# Patient Record
Sex: Male | Born: 1975 | Race: Black or African American | Hispanic: No | Marital: Single | Smoking: Never smoker
Health system: Southern US, Community
[De-identification: ages and names within clinical notes are randomized; demographics above are authoritative.]

## PROBLEM LIST (undated history)

## (undated) DIAGNOSIS — N200 Calculus of kidney: Secondary | ICD-10-CM

## (undated) DIAGNOSIS — I10 Essential (primary) hypertension: Secondary | ICD-10-CM

## (undated) DIAGNOSIS — B20 Human immunodeficiency virus [HIV] disease: Secondary | ICD-10-CM

## (undated) DIAGNOSIS — R569 Unspecified convulsions: Secondary | ICD-10-CM

## (undated) DIAGNOSIS — N289 Disorder of kidney and ureter, unspecified: Secondary | ICD-10-CM

## (undated) DIAGNOSIS — K259 Gastric ulcer, unspecified as acute or chronic, without hemorrhage or perforation: Secondary | ICD-10-CM

## (undated) DIAGNOSIS — G629 Polyneuropathy, unspecified: Secondary | ICD-10-CM

---

## 2020-10-01 ENCOUNTER — Emergency Department (HOSPITAL_COMMUNITY)
Admission: EM | Admit: 2020-10-01 | Discharge: 2020-10-01 | Disposition: A | Discharge status: Home / Self Care | Payer: Self-pay | Attending: Emergency Medicine | Admitting: Emergency Medicine

## 2020-10-01 ENCOUNTER — Other Ambulatory Visit: Payer: Self-pay

## 2020-10-01 ENCOUNTER — Encounter (HOSPITAL_COMMUNITY): Payer: Self-pay

## 2020-10-01 DIAGNOSIS — R519 Headache, unspecified: Secondary | ICD-10-CM | POA: Insufficient documentation

## 2020-10-01 DIAGNOSIS — Z21 Asymptomatic human immunodeficiency virus [HIV] infection status: Secondary | ICD-10-CM | POA: Insufficient documentation

## 2020-10-01 DIAGNOSIS — N189 Chronic kidney disease, unspecified: Secondary | ICD-10-CM | POA: Insufficient documentation

## 2020-10-01 LAB — CBC WITH DIFFERENTIAL/PLATELET
Abs Immature Granulocytes: 0.02 10*3/uL (ref 0.00–0.07)
Basophils Absolute: 0 10*3/uL (ref 0.0–0.1)
Basophils Relative: 1 %
Eosinophils Absolute: 0.1 10*3/uL (ref 0.0–0.5)
Eosinophils Relative: 1 %
HCT: 33.2 % — ABNORMAL LOW (ref 39.0–52.0)
Hemoglobin: 9.6 g/dL — ABNORMAL LOW (ref 13.0–17.0)
Immature Granulocytes: 0 %
Lymphocytes Relative: 21 %
Lymphs Abs: 1.1 10*3/uL (ref 0.7–4.0)
MCH: 24 pg — ABNORMAL LOW (ref 26.0–34.0)
MCHC: 28.9 g/dL — ABNORMAL LOW (ref 30.0–36.0)
MCV: 83 fL (ref 80.0–100.0)
Monocytes Absolute: 0.7 10*3/uL (ref 0.1–1.0)
Monocytes Relative: 13 %
Neutro Abs: 3.3 10*3/uL (ref 1.7–7.7)
Neutrophils Relative %: 64 %
Platelets: 135 10*3/uL — ABNORMAL LOW (ref 150–400)
RBC: 4 MIL/uL — ABNORMAL LOW (ref 4.22–5.81)
RDW: 13.9 % (ref 11.5–15.5)
WBC: 5.2 10*3/uL (ref 4.0–10.5)
nRBC: 0 % (ref 0.0–0.2)

## 2020-10-01 LAB — BASIC METABOLIC PANEL
Anion gap: 7 (ref 5–15)
BUN: 30 mg/dL — ABNORMAL HIGH (ref 6–20)
CO2: 29 mmol/L (ref 22–32)
Calcium: 9.2 mg/dL (ref 8.9–10.3)
Chloride: 106 mmol/L (ref 98–111)
Creatinine, Ser: 2.22 mg/dL — ABNORMAL HIGH (ref 0.61–1.24)
GFR, Estimated: 36 mL/min — ABNORMAL LOW (ref >60)
Glucose, Bld: 96 mg/dL (ref 70–99)
Potassium: 4.2 mmol/L (ref 3.5–5.1)
Sodium: 142 mmol/L (ref 135–145)

## 2020-10-01 MED ORDER — DROPERIDOL 2.5 MG/ML IJ SOLN
2.5000 mg | Freq: Once | INTRAMUSCULAR | Status: AC
  Administered 2020-10-01: 11:00:00 2.5 mg via INTRAVENOUS
  Filled 2020-10-01: qty 2

## 2020-10-01 NOTE — ED Notes (Signed)
Pt is irate in lobby complaining about wait time. Still refusing to allow a blood pressure to be taken. Pt is self inducing vomiting by stimulating his gag reflex with fingers. Pt also refuses to use an emesis bag

## 2020-10-01 NOTE — Progress Notes (Signed)
TOC CM reviewed chart to assist with arranging appt. No phone number listed to call pt. TOC CSW did put information on chart to arrange appt at the [facility] and [facility]. Isidoro Donning RN CCM, [facility] ED TOC CM [phone]

## 2020-10-01 NOTE — ED Triage Notes (Signed)
Pt arrives EMS with c/o recurrent headache. Pt not answering triage questions and rips bp cuff off during triage.

## 2020-10-01 NOTE — Clinical Social Work Note (Signed)
Transition of Care [facility]) - Emergency Department Mini Assessment  Patient Details  Name: Chad Campos MRN: [id number] Date of Birth: 1976/02/24  Transition of Care [facility]) CM/SW Contact:    Ewing Schlein, LCSW Phone Number: 10/01/2020, 11:48 AM  Clinical Narrative: Patient is a 44 year old male who presented to the ED for a headache. TOC consulted for PCP needs as patient is uninsured. [facility] was added to AVS. TOC signing off.  ED Mini Assessment: What brought you to the Emergency Department? : Headache Barriers to Discharge: Inadequate or no insurance,ED PCP establishment Barrier interventions: Referral information for [facility] Means of departure: Car Interventions which prevented an admission or readmission: PCP Appointment Scheduled  Patient Contact and Communications Choice offered to / list presented to : Patient  Admission diagnosis:  Migraine There are no problems to display for this patient.  PCP:  Patient, No Pcp Per Pharmacy:   [id number] - [place], [place] - [address] AT [facility] OF [address] [place] [address] Phone: [phone] Fax: [phone]

## 2020-10-01 NOTE — ED Provider Notes (Signed)
[facility]-EMERGENCY DEPT Provider Note   CSN: [id number] Arrival date & time: 10/01/20  5681     History Chief Complaint  Patient presents with  . Headache    Chad Campos is a 44 y.o. male.  The history is provided by the patient.   Chad Campos is a 44 y.o. male who presents to the Emergency Department complaining of HA.  He presents to the ED complaining of HA and bilateral eye pain that started at 6pm last night.  Has photophobia.  Has vomiting x 1 he has a history of multiple similar episodes in the past that he attributes to both migraine headaches and history of facial hardware. He moved to the area a few days ago from [place] and has not established local care. He does have a history of HIV, kidney stones. He does not have a local physician. He is not taking HIV meds currently. Prior care has been at [facility] in [place].  Denies fever, numbness, weakness, cough, shortness of breath.      History reviewed. No pertinent past medical history.  There are no problems to display for this patient.   History reviewed. No pertinent surgical history.     No family history on file.  Social History   Tobacco Use  . Smoking status: Never Smoker  . Smokeless tobacco: Never Used  Substance Use Topics  . Alcohol use: Never  . Drug use: Never    Home Medications Prior to Admission medications   Not on File    Allergies    Patient has no known allergies.  Review of Systems   Review of Systems  All other systems reviewed and are negative.   Physical Exam Updated Vital Signs BP (!) 157/113 (BP Location: Left Arm)   Pulse 64   Temp 97.9 F (36.6 C) (Oral)   Resp 16   SpO2 99%   Physical Exam Vitals and nursing note reviewed.  Constitutional:      Appearance: He is well-developed and well-nourished.  HENT:     Head: Normocephalic and atraumatic.  Eyes:     Extraocular Movements: Extraocular movements intact.      Conjunctiva/sclera: Conjunctivae normal.     Pupils: Pupils are equal, round, and reactive to light.     Comments: Photophobia  Cardiovascular:     Rate and Rhythm: Normal rate and regular rhythm.     Heart sounds: No murmur heard.   Pulmonary:     Effort: Pulmonary effort is normal. No respiratory distress.     Breath sounds: Normal breath sounds.  Abdominal:     Palpations: Abdomen is soft.     Tenderness: There is no abdominal tenderness. There is no guarding or rebound.  Musculoskeletal:        General: No tenderness or edema.     Cervical back: Neck supple.  Skin:    General: Skin is warm and dry.  Neurological:     Mental Status: He is alert and oriented to person, place, and time.     Comments: Followed five strength in all four extremities with sensation to light touch intact in all four extremities  Psychiatric:        Mood and Affect: Mood and affect normal.        Behavior: Behavior normal.     ED Results / Procedures / Treatments   Labs (all labs ordered are listed, but only abnormal results are displayed) Labs Reviewed  BASIC METABOLIC PANEL - Abnormal;  Notable for the following components:      Result Value   BUN 30 (*)    Creatinine, Ser 2.22 (*)    GFR, Estimated 36 (*)    All other components within normal limits  CBC WITH DIFFERENTIAL/PLATELET - Abnormal; Notable for the following components:   RBC 4.00 (*)    Hemoglobin 9.6 (*)    HCT 33.2 (*)    MCH 24.0 (*)    MCHC 28.9 (*)    Platelets 135 (*)    All other components within normal limits    EKG EKG Interpretation  Date/Time:  Monday October 01 2020 07:55:10 EST Ventricular Rate:  65 PR Interval:    QRS Duration: 92 QT Interval:  394 QTC Calculation: 410 R Axis:   71 Text Interpretation: Sinus rhythm Consider left atrial enlargement Confirmed by Tilden Fossa [phone]) on 10/01/2020 9:22:51 AM   Radiology No results found.  Procedures Procedures (including critical care  time)  Medications Ordered in ED Medications  droperidol (INAPSINE) 2.5 MG/ML injection 2.5 mg (2.5 mg Intravenous Given 10/01/20 1041)    ED Course  I have reviewed the triage vital signs and the nursing notes.  Pertinent labs & imaging results that were available during my care of the patient were reviewed by me and considered in my medical decision making (see chart for details).    MDM Rules/Calculators/A&P                         patient with history of HIV here for evaluation of headache. He currently does not have a PCP locally and is not currently on his medications. He is non-toxic appearing on evaluation with no focal neurologic deficits. He states that this headache is typical for his headaches. He was treated with medications in the emergency department with resolution of his symptoms. Presentation is not consistent with subarachnoid hemorrhage, meningitis, space occupying lesion. Will refer to ID for further treatment for his HIV. Attempted to obtain records from [facility] system in [place], these were not available at the time of patient's discharge.  After discharge his records were obtainable from August 2020. Per the report he has history of HIV, hypertension, seizure, kidney stones, stage 4 kidney disease, suicide attempt, schizoaffective disorder. At that time he was taking prezista, descovy, norvir, trileptal, depakote and gabapentin.  Final Clinical Impression(s) / ED Diagnoses Final diagnoses:  Bad headache  Chronic kidney disease, unspecified CKD stage    Rx / DC Orders ED Discharge Orders         Ordered    Ambulatory referral to Infectious Disease       Comments: Pt with HIV, moved to area from [place] a few days ago, out of meds.   10/01/20 1131           Tilden Fossa, MD 10/01/20 1610

## 2020-10-02 ENCOUNTER — Telehealth: Payer: Self-pay | Admitting: *Deleted

## 2020-10-02 NOTE — Telephone Encounter (Signed)
Following up on new patient referral to [facility].  Claris Che will reach out to patient to have him sign release of information for proof of care/transfer appointment. RN made partial referral to [facility], for [employer], in case patient is in need. Marthann Schiller will need notification once referral to [facility] is accepted. Andree Coss, RN

## 2020-10-02 NOTE — Telephone Encounter (Signed)
His phone number shows [phone] it goes to a business and they don't know patient  have called the contact and no answer

## 2021-02-23 ENCOUNTER — Other Ambulatory Visit: Payer: Self-pay

## 2021-02-23 ENCOUNTER — Inpatient Hospital Stay (HOSPITAL_COMMUNITY)
Admission: EM | Admit: 2021-02-23 | Discharge: 2021-03-05 | DRG: 683 | Disposition: A | Discharge status: Home / Self Care | Payer: Self-pay | Attending: Internal Medicine | Admitting: Internal Medicine

## 2021-02-23 DIAGNOSIS — N1832 Chronic kidney disease, stage 3b: Secondary | ICD-10-CM | POA: Diagnosis present

## 2021-02-23 DIAGNOSIS — G629 Polyneuropathy, unspecified: Secondary | ICD-10-CM | POA: Diagnosis present

## 2021-02-23 DIAGNOSIS — F203 Undifferentiated schizophrenia: Secondary | ICD-10-CM | POA: Diagnosis present

## 2021-02-23 DIAGNOSIS — R45851 Suicidal ideations: Principal | ICD-10-CM | POA: Diagnosis present

## 2021-02-23 DIAGNOSIS — E0590 Thyrotoxicosis, unspecified without thyrotoxic crisis or storm: Secondary | ICD-10-CM | POA: Diagnosis present

## 2021-02-23 DIAGNOSIS — F1910 Other psychoactive substance abuse, uncomplicated: Secondary | ICD-10-CM | POA: Diagnosis present

## 2021-02-23 DIAGNOSIS — Z87891 Personal history of nicotine dependence: Secondary | ICD-10-CM

## 2021-02-23 DIAGNOSIS — D631 Anemia in chronic kidney disease: Secondary | ICD-10-CM | POA: Diagnosis present

## 2021-02-23 DIAGNOSIS — F332 Major depressive disorder, recurrent severe without psychotic features: Secondary | ICD-10-CM | POA: Diagnosis present

## 2021-02-23 DIAGNOSIS — R45850 Homicidal ideations: Secondary | ICD-10-CM | POA: Diagnosis present

## 2021-02-23 DIAGNOSIS — F209 Schizophrenia, unspecified: Secondary | ICD-10-CM | POA: Diagnosis present

## 2021-02-23 DIAGNOSIS — K5900 Constipation, unspecified: Secondary | ICD-10-CM | POA: Diagnosis not present

## 2021-02-23 DIAGNOSIS — I129 Hypertensive chronic kidney disease with stage 1 through stage 4 chronic kidney disease, or unspecified chronic kidney disease: Secondary | ICD-10-CM | POA: Diagnosis present

## 2021-02-23 DIAGNOSIS — R569 Unspecified convulsions: Secondary | ICD-10-CM | POA: Diagnosis present

## 2021-02-23 DIAGNOSIS — Z20822 Contact with and (suspected) exposure to covid-19: Secondary | ICD-10-CM | POA: Diagnosis present

## 2021-02-23 DIAGNOSIS — B181 Chronic viral hepatitis B without delta-agent: Secondary | ICD-10-CM | POA: Diagnosis present

## 2021-02-23 DIAGNOSIS — E875 Hyperkalemia: Secondary | ICD-10-CM | POA: Diagnosis present

## 2021-02-23 DIAGNOSIS — B20 Human immunodeficiency virus [HIV] disease: Secondary | ICD-10-CM | POA: Diagnosis present

## 2021-02-23 DIAGNOSIS — N179 Acute kidney failure, unspecified: Principal | ICD-10-CM | POA: Diagnosis present

## 2021-02-23 DIAGNOSIS — D75A Glucose-6-phosphate dehydrogenase (G6PD) deficiency without anemia: Secondary | ICD-10-CM | POA: Diagnosis present

## 2021-02-23 DIAGNOSIS — F1420 Cocaine dependence, uncomplicated: Secondary | ICD-10-CM | POA: Diagnosis present

## 2021-02-23 DIAGNOSIS — Z781 Physical restraint status: Secondary | ICD-10-CM

## 2021-02-23 DIAGNOSIS — Z87442 Personal history of urinary calculi: Secondary | ICD-10-CM

## 2021-02-23 DIAGNOSIS — F1210 Cannabis abuse, uncomplicated: Secondary | ICD-10-CM | POA: Diagnosis present

## 2021-02-23 LAB — COMPREHENSIVE METABOLIC PANEL
ALT: 33 U/L (ref 0–44)
AST: 51 U/L — ABNORMAL HIGH (ref 15–41)
Albumin: 2.9 g/dL — ABNORMAL LOW (ref 3.5–5.0)
Alkaline Phosphatase: 62 U/L (ref 38–126)
Anion gap: 9 (ref 5–15)
BUN: 37 mg/dL — ABNORMAL HIGH (ref 6–20)
CO2: 21 mmol/L — ABNORMAL LOW (ref 22–32)
Calcium: 9.9 mg/dL (ref 8.9–10.3)
Chloride: 108 mmol/L (ref 98–111)
Creatinine, Ser: 3.14 mg/dL — ABNORMAL HIGH (ref 0.61–1.24)
GFR, Estimated: 24 mL/min — ABNORMAL LOW (ref >60)
Glucose, Bld: 102 mg/dL — ABNORMAL HIGH (ref 70–99)
Potassium: 3.7 mmol/L (ref 3.5–5.1)
Sodium: 138 mmol/L (ref 135–145)
Total Bilirubin: 0.6 mg/dL (ref 0.3–1.2)
Total Protein: 7.5 g/dL (ref 6.5–8.1)

## 2021-02-23 LAB — CBC WITH DIFFERENTIAL/PLATELET
Abs Immature Granulocytes: 0.01 10*3/uL (ref 0.00–0.07)
Basophils Absolute: 0 10*3/uL (ref 0.0–0.1)
Basophils Relative: 1 %
Eosinophils Absolute: 0.1 10*3/uL (ref 0.0–0.5)
Eosinophils Relative: 2 %
HCT: 34 % — ABNORMAL LOW (ref 39.0–52.0)
Hemoglobin: 10.2 g/dL — ABNORMAL LOW (ref 13.0–17.0)
Immature Granulocytes: 0 %
Lymphocytes Relative: 27 %
Lymphs Abs: 1.2 10*3/uL (ref 0.7–4.0)
MCH: 24.6 pg — ABNORMAL LOW (ref 26.0–34.0)
MCHC: 30 g/dL (ref 30.0–36.0)
MCV: 81.9 fL (ref 80.0–100.0)
Monocytes Absolute: 0.7 10*3/uL (ref 0.1–1.0)
Monocytes Relative: 15 %
Neutro Abs: 2.5 10*3/uL (ref 1.7–7.7)
Neutrophils Relative %: 55 %
Platelets: 113 10*3/uL — ABNORMAL LOW (ref 150–400)
RBC: 4.15 MIL/uL — ABNORMAL LOW (ref 4.22–5.81)
RDW: 14.4 % (ref 11.5–15.5)
WBC: 4.4 10*3/uL (ref 4.0–10.5)
nRBC: 0 % (ref 0.0–0.2)

## 2021-02-23 LAB — ACETAMINOPHEN LEVEL: Acetaminophen (Tylenol), Serum: <10 ug/mL — ABNORMAL LOW (ref 10–30)

## 2021-02-23 LAB — RESP PANEL BY RT-PCR (FLU A&B, COVID) ARPGX2
Influenza A by PCR: NEGATIVE
Influenza B by PCR: NEGATIVE
SARS Coronavirus 2 by RT PCR: NEGATIVE

## 2021-02-23 LAB — RAPID URINE DRUG SCREEN, HOSP PERFORMED
Amphetamines: NOT DETECTED
Barbiturates: NOT DETECTED
Benzodiazepines: NOT DETECTED
Cocaine: POSITIVE — AB
Opiates: NOT DETECTED
Tetrahydrocannabinol: POSITIVE — AB

## 2021-02-23 LAB — ETHANOL: Alcohol, Ethyl (B): <10 mg/dL (ref <10)

## 2021-02-23 LAB — SALICYLATE LEVEL: Salicylate Lvl: <7 mg/dL — ABNORMAL LOW (ref 7.0–30.0)

## 2021-02-23 MED ORDER — ACETAMINOPHEN 650 MG RE SUPP: 650.0000 mg | Freq: Four times a day (QID) | RECTAL | Status: DC | PRN

## 2021-02-23 MED ORDER — LORAZEPAM 1 MG PO TABS
1.0000 mg | ORAL_TABLET | ORAL | Status: AC | PRN
  Administered 2021-02-23: 1 mg via ORAL
  Filled 2021-02-23: qty 1

## 2021-02-23 MED ORDER — ZIPRASIDONE MESYLATE 20 MG IM SOLR
20.0000 mg | INTRAMUSCULAR | Status: DC | PRN
  Filled 2021-02-23 (×2): qty 20

## 2021-02-23 MED ORDER — IBUPROFEN 600 MG PO TABS
600.0000 mg | ORAL_TABLET | Freq: Once | ORAL | Status: DC
  Filled 2021-02-23: qty 1

## 2021-02-23 MED ORDER — SENNOSIDES-DOCUSATE SODIUM 8.6-50 MG PO TABS
1.0000 | ORAL_TABLET | Freq: Every evening | ORAL | Status: DC | PRN
  Filled 2021-02-23: qty 1

## 2021-02-23 MED ORDER — SODIUM CHLORIDE 0.9% FLUSH
3.0000 mL | Freq: Two times a day (BID) | INTRAVENOUS | Status: DC
  Administered 2021-02-24 – 2021-03-05 (×17): 3 mL via INTRAVENOUS

## 2021-02-23 MED ORDER — ACETAMINOPHEN 325 MG PO TABS
650.0000 mg | ORAL_TABLET | Freq: Four times a day (QID) | ORAL | Status: DC | PRN
  Administered 2021-02-23 – 2021-02-28 (×4): 650 mg via ORAL
  Filled 2021-02-23 (×5): qty 2

## 2021-02-23 MED ORDER — LORAZEPAM 1 MG PO TABS: 1.0000 mg | ORAL_TABLET | Freq: Once | ORAL | Status: DC

## 2021-02-23 MED ORDER — STERILE WATER FOR INJECTION IJ SOLN
INTRAMUSCULAR | Status: AC
  Administered 2021-02-23: 1.2 mL
  Filled 2021-02-23: qty 10

## 2021-02-23 MED ORDER — ZIPRASIDONE MESYLATE 20 MG IM SOLR
20.0000 mg | Freq: Once | INTRAMUSCULAR | Status: AC
  Administered 2021-02-23: 20 mg via INTRAMUSCULAR
  Filled 2021-02-23: qty 20

## 2021-02-23 MED ORDER — SODIUM CHLORIDE 0.9 % IV BOLUS
1000.0000 mL | Freq: Once | INTRAVENOUS | Status: AC
  Administered 2021-02-23: 1000 mL via INTRAVENOUS

## 2021-02-23 MED ORDER — OLANZAPINE 5 MG PO TBDP
5.0000 mg | ORAL_TABLET | Freq: Three times a day (TID) | ORAL | Status: DC | PRN
  Administered 2021-03-01: 5 mg via ORAL
  Filled 2021-02-23 (×6): qty 1

## 2021-02-23 MED ORDER — ACETAMINOPHEN 325 MG PO TABS: 650.0000 mg | ORAL_TABLET | Freq: Once | ORAL | Status: DC

## 2021-02-23 MED ORDER — LACTATED RINGERS IV SOLN: INTRAVENOUS | Status: AC

## 2021-02-23 MED ORDER — ENOXAPARIN SODIUM 30 MG/0.3ML IJ SOSY: 30.0000 mg | PREFILLED_SYRINGE | INTRAMUSCULAR | Status: DC

## 2021-02-23 NOTE — Plan of Care (Signed)
  Problem: Education: Goal: Knowledge of General Education information will improve Description: Including pain rating scale, medication(s)/side effects and non-pharmacologic comfort measures Outcome: Completed/Met
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
## 2021-02-23 NOTE — ED Notes (Signed)
Report given to [facility] [phone]. Security and 2 NT to take pt to floor. IVC paperwork going with pt.
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
## 2021-02-23 NOTE — Progress Notes (Signed)
   02/23/21 2000  Vitals  Temp 98.6 F (37 C)  Temp Source Oral  BP (!) 156/113  MAP (mmHg) 125  Pulse Rate 63  ECG Heart Rate 65  Resp (!) 22  MD Notified of elevated BP.

## 2021-02-23 NOTE — ED Notes (Addendum)
Report received from [facility]. Pt resting after receiving medication. Pt no longer in restraints. Restraints removed by previous RN. No sitter available for pt.
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
## 2021-02-23 NOTE — ED Notes (Signed)
Report received. Patient sleeping, respirations even and unlabored. No distress. Remains on continuous cardiac and 02 sat monitoring.

## 2021-02-23 NOTE — Progress Notes (Signed)
Report received. His room is ready.  °

## 2021-02-23 NOTE — Progress Notes (Signed)
New Admission Note:  Arrival Method: Stretcher Mental Orientation: Alert and oriented x 4 Telemetry: N/A Assessment: Completed Skin: Warm and dry.  IV: NSL Pain: 6/10 right side Tubes: N/A Safety Measures: Safety Fall Prevention Plan initiated.  Admission: Completed 5 M  Orientation: Patient has been orientated to the room, unit and the staff. Welcome booklet given.  Family: N/A  Orders have been reviewed and implemented. Will continue to monitor the patient. Call light has been placed within reach and bed alarm has been activated.   Guilford Shi BSN, RN  Phone Number: [phone]

## 2021-02-23 NOTE — ED Notes (Signed)
Pt violent wrist restraints removed as pt is resting comfortably at this time. Respirations even/unlabored. Visible chest rise and fall. Pt appears in NAD.
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
## 2021-02-23 NOTE — ED Notes (Signed)
IVC paperwork secured in pts folder.

## 2021-02-23 NOTE — ED Notes (Signed)
Care resumed for this pt at 0700 by this RN and Vernard Gambles, RN.

## 2021-02-23 NOTE — ED Notes (Signed)
Per [employer] officers at bedside, pt is going to be emergent IVCd.

## 2021-02-24 ENCOUNTER — Encounter (HOSPITAL_COMMUNITY): Payer: Self-pay | Admitting: Internal Medicine

## 2021-02-24 DIAGNOSIS — F332 Major depressive disorder, recurrent severe without psychotic features: Secondary | ICD-10-CM | POA: Diagnosis present

## 2021-02-24 DIAGNOSIS — F209 Schizophrenia, unspecified: Secondary | ICD-10-CM | POA: Diagnosis present

## 2021-02-24 DIAGNOSIS — F201 Disorganized schizophrenia: Secondary | ICD-10-CM

## 2021-02-24 DIAGNOSIS — R45851 Suicidal ideations: Secondary | ICD-10-CM

## 2021-02-24 DIAGNOSIS — F1910 Other psychoactive substance abuse, uncomplicated: Secondary | ICD-10-CM | POA: Diagnosis present

## 2021-02-24 LAB — BASIC METABOLIC PANEL
Anion gap: 4 — ABNORMAL LOW (ref 5–15)
BUN: 28 mg/dL — ABNORMAL HIGH (ref 6–20)
CO2: 25 mmol/L (ref 22–32)
Calcium: 9.2 mg/dL (ref 8.9–10.3)
Chloride: 109 mmol/L (ref 98–111)
Creatinine, Ser: 2.36 mg/dL — ABNORMAL HIGH (ref 0.61–1.24)
GFR, Estimated: 34 mL/min — ABNORMAL LOW (ref >60)
Glucose, Bld: 84 mg/dL (ref 70–99)
Potassium: 4.4 mmol/L (ref 3.5–5.1)
Sodium: 138 mmol/L (ref 135–145)

## 2021-02-24 LAB — IRON AND TIBC
Iron: 55 ug/dL (ref 45–182)
Saturation Ratios: 22 % (ref 17.9–39.5)
TIBC: 251 ug/dL (ref 250–450)
UIBC: 196 ug/dL

## 2021-02-24 LAB — CBC
HCT: 37.4 % — ABNORMAL LOW (ref 39.0–52.0)
Hemoglobin: 10.9 g/dL — ABNORMAL LOW (ref 13.0–17.0)
MCH: 23.8 pg — ABNORMAL LOW (ref 26.0–34.0)
MCHC: 29.1 g/dL — ABNORMAL LOW (ref 30.0–36.0)
MCV: 81.7 fL (ref 80.0–100.0)
Platelets: 132 10*3/uL — ABNORMAL LOW (ref 150–400)
RBC: 4.58 MIL/uL (ref 4.22–5.81)
RDW: 14.6 % (ref 11.5–15.5)
WBC: 3.8 10*3/uL — ABNORMAL LOW (ref 4.0–10.5)
nRBC: 0 % (ref 0.0–0.2)

## 2021-02-24 LAB — HEPATITIS C ANTIBODY: HCV Ab: NONREACTIVE

## 2021-02-24 LAB — HEMOGLOBIN A1C
Hgb A1c MFr Bld: 4.6 % — ABNORMAL LOW (ref 4.8–5.6)
Mean Plasma Glucose: 85.32 mg/dL

## 2021-02-24 LAB — FERRITIN: Ferritin: 147 ng/mL (ref 24–336)

## 2021-02-24 LAB — HIV ANTIBODY (ROUTINE TESTING W REFLEX): HIV Screen 4th Generation wRfx: REACTIVE — AB

## 2021-02-24 LAB — TSH: TSH: 0.264 u[IU]/mL — ABNORMAL LOW (ref 0.350–4.500)

## 2021-02-24 MED ORDER — ENOXAPARIN SODIUM 40 MG/0.4ML IJ SOSY
40.0000 mg | PREFILLED_SYRINGE | INTRAMUSCULAR | Status: DC
  Administered 2021-02-24 – 2021-03-05 (×9): 40 mg via SUBCUTANEOUS
  Filled 2021-02-24 (×9): qty 0.4

## 2021-02-24 MED ORDER — AMLODIPINE BESYLATE 5 MG PO TABS
5.0000 mg | ORAL_TABLET | Freq: Every day | ORAL | Status: DC
  Administered 2021-02-24 – 2021-03-05 (×10): 5 mg via ORAL
  Filled 2021-02-24 (×9): qty 1

## 2021-02-24 MED ORDER — OLANZAPINE 5 MG PO TBDP
5.0000 mg | ORAL_TABLET | Freq: Two times a day (BID) | ORAL | Status: DC
  Administered 2021-02-24 – 2021-02-27 (×7): 5 mg via ORAL
  Filled 2021-02-24 (×8): qty 1

## 2021-02-24 MED ORDER — LORAZEPAM 1 MG PO TABS
1.0000 mg | ORAL_TABLET | Freq: Two times a day (BID) | ORAL | Status: DC | PRN
  Administered 2021-02-24 – 2021-02-25 (×2): 1 mg via ORAL
  Filled 2021-02-24 (×2): qty 1

## 2021-02-24 MED ORDER — PREGABALIN 75 MG PO CAPS
75.0000 mg | ORAL_CAPSULE | Freq: Three times a day (TID) | ORAL | Status: DC
  Administered 2021-02-24 – 2021-03-05 (×26): 75 mg via ORAL
  Filled 2021-02-24 (×27): qty 1

## 2021-02-24 MED ORDER — LORAZEPAM 1 MG PO TABS
1.0000 mg | ORAL_TABLET | Freq: Two times a day (BID) | ORAL | Status: DC
  Administered 2021-02-24: 1 mg via ORAL
  Filled 2021-02-24: qty 1
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
## 2021-02-24 NOTE — Social Work (Signed)
CSW was alerted that pt will need an inpt psych bed. CSW reached out to [facility] and made the referral. CSW was informed that information would be given to [facility] to make a determination on acceptance. It is suggested that the referral be followed up on tomorrow.

## 2021-02-25 DIAGNOSIS — F333 Major depressive disorder, recurrent, severe with psychotic symptoms: Secondary | ICD-10-CM

## 2021-02-25 DIAGNOSIS — B181 Chronic viral hepatitis B without delta-agent: Secondary | ICD-10-CM

## 2021-02-25 DIAGNOSIS — R45850 Homicidal ideations: Secondary | ICD-10-CM

## 2021-02-25 DIAGNOSIS — B1910 Unspecified viral hepatitis B without hepatic coma: Secondary | ICD-10-CM

## 2021-02-25 DIAGNOSIS — F1910 Other psychoactive substance abuse, uncomplicated: Secondary | ICD-10-CM

## 2021-02-25 DIAGNOSIS — B20 Human immunodeficiency virus [HIV] disease: Secondary | ICD-10-CM

## 2021-02-25 DIAGNOSIS — R45851 Suicidal ideations: Secondary | ICD-10-CM

## 2021-02-25 DIAGNOSIS — G9009 Other idiopathic peripheral autonomic neuropathy: Secondary | ICD-10-CM

## 2021-02-25 DIAGNOSIS — N179 Acute kidney failure, unspecified: Principal | ICD-10-CM

## 2021-02-25 DIAGNOSIS — D61818 Other pancytopenia: Secondary | ICD-10-CM

## 2021-02-25 LAB — BASIC METABOLIC PANEL
Anion gap: 6 (ref 5–15)
BUN: 27 mg/dL — ABNORMAL HIGH (ref 6–20)
CO2: 26 mmol/L (ref 22–32)
Calcium: 9.3 mg/dL (ref 8.9–10.3)
Chloride: 107 mmol/L (ref 98–111)
Creatinine, Ser: 2.34 mg/dL — ABNORMAL HIGH (ref 0.61–1.24)
GFR, Estimated: 34 mL/min — ABNORMAL LOW (ref >60)
Glucose, Bld: 86 mg/dL (ref 70–99)
Potassium: 4 mmol/L (ref 3.5–5.1)
Sodium: 139 mmol/L (ref 135–145)

## 2021-02-25 LAB — T4, FREE: Free T4: 0.82 ng/dL (ref 0.61–1.12)

## 2021-02-25 LAB — CBC
HCT: 37.7 % — ABNORMAL LOW (ref 39.0–52.0)
Hemoglobin: 11.2 g/dL — ABNORMAL LOW (ref 13.0–17.0)
MCH: 24.3 pg — ABNORMAL LOW (ref 26.0–34.0)
MCHC: 29.7 g/dL — ABNORMAL LOW (ref 30.0–36.0)
MCV: 82 fL (ref 80.0–100.0)
Platelets: 122 10*3/uL — ABNORMAL LOW (ref 150–400)
RBC: 4.6 MIL/uL (ref 4.22–5.81)
RDW: 14.5 % (ref 11.5–15.5)
WBC: 3.8 10*3/uL — ABNORMAL LOW (ref 4.0–10.5)
nRBC: 0 % (ref 0.0–0.2)

## 2021-02-25 LAB — T-HELPER CELLS (CD4) COUNT (NOT AT ARMC)
CD4 % Helper T Cell: 17 % — ABNORMAL LOW (ref 33–65)
CD4 T Cell Abs: 196 /uL — ABNORMAL LOW (ref 400–1790)

## 2021-02-25 MED ORDER — EMTRICITABINE-TENOFOVIR AF 200-25 MG PO TABS
1.0000 | ORAL_TABLET | Freq: Every day | ORAL | Status: DC
  Administered 2021-02-25 – 2021-03-05 (×9): 1 via ORAL
  Filled 2021-02-25 (×9): qty 1

## 2021-02-25 MED ORDER — LOSARTAN POTASSIUM 25 MG PO TABS
25.0000 mg | ORAL_TABLET | Freq: Every day | ORAL | Status: DC
  Administered 2021-02-25 – 2021-02-26 (×2): 25 mg via ORAL
  Filled 2021-02-25 (×3): qty 1

## 2021-02-25 MED ORDER — DOLUTEGRAVIR SODIUM 50 MG PO TABS
50.0000 mg | ORAL_TABLET | Freq: Every day | ORAL | Status: DC
  Administered 2021-02-25 – 2021-03-05 (×9): 50 mg via ORAL
  Filled 2021-02-25 (×9): qty 1

## 2021-02-25 NOTE — Progress Notes (Addendum)
Subjective:  HD2  Patient evaluated at bedside this morning. Reports that he was hearing voices yesterday and that he wants to do everything he can to stop them. Continues to endorse frustration over family members. Denies current suicidal or homicidal ideation.  Please note patient's name is Jesus Roberson. There is a duplicate chart: MRN [id number].   Objective:  Vital signs in last 24 hours: Vitals:   02/23/21 2200 02/24/21 0547 02/24/21 2102 02/25/21 0536  BP:  (!) 149/103 (!) 159/115 (!) 132/100  Pulse:  62 79 61  Resp: 20 18 18 17   Temp:  97.6 F (36.4 C) 98.6 F (37 C) 98.5 F (36.9 C)  TempSrc:  Oral Oral Oral  SpO2:  98% 97% 98%  Weight:    83.1 kg  Height:       Physical Exam: General: Laying in bed comfortably in no acute distress CV: Regular rate, rhythm. No murmurs, rubs, gallops. Distal 2+ bilaterally. Pulm: Normal work of breathing. Clear to auscultation bilaterally. MSK: Normal bulk, tone. No pitting edema bilaterally. Psych: Tangential speech. Obsessive thought content.  Neuro: Awake, alert, no obvious focal deficits.  CBC Latest Ref Rng & Units 02/25/2021 02/24/2021 02/23/2021  WBC 4.0 - 10.5 K/uL 3.8(L) 3.8(L) 4.4  Hemoglobin 13.0 - 17.0 g/dL 11.2(L) 10.9(L) 10.2(L)  Hematocrit 39.0 - 52.0 % 37.7(L) 37.4(L) 34.0(L)  Platelets 150 - 400 K/uL 122(L) 132(L) 113(L)   BMP Latest Ref Rng & Units 02/25/2021 02/24/2021 02/23/2021  Glucose 70 - 99 mg/dL 86 84 02/25/2021)  BUN 6 - 20 mg/dL 427(C) 62(B) 76(E)  Creatinine 0.61 - 1.24 mg/dL 83(T) 5.17(O) 1.60(V)  Sodium 135 - 145 mmol/L 139 138 138  Potassium 3.5 - 5.1 mmol/L 4.0 4.4 3.7  Chloride 98 - 111 mmol/L 107 109 108  CO2 22 - 32 mmol/L 26 25 21(L)  Calcium 8.9 - 10.3 mg/dL 9.3 9.2 9.9   Assessment/Plan: Jesus is 45yo person with schizophrenia, HIV, CKD, hx seizures, peripheral neuropathy, hx hepatitis B admitted 5/21 after suicidal attempt, medically stable pending [facility] placement.  Principal Problem:    Schizophrenia (HCC) Active Problems:   AKI (acute kidney injury) (HCC)   MDD (major depressive disorder), recurrent episode, severe (HCC)   Polysubstance abuse (HCC)   Suicidal intent  #Suicide attempt #Schizophrenia Appreciate psychiatry's assistance with this case. Currently not endorsing auditory hallucinations or suicidal/homicidal thoughts. Continues to have tangential speech. Feel as though patient would greatly benefit from inpatient psychiatry in order stabilize his psychiatric disorders to best set him up for success after discharge. He is currently medically stable. - Continue olanzapine 5mg  BID - Agitation orders as needed - 1:1 sitter - Pending placement with [facility]  #Hypertension Blood pressure continues to be mildly elevated despite starting amlodipine. Patient would benefit from close control, especially as he has not been on medications for at least three months. Will add ARB today given extra benefit with his history of CKD.  - Continue amlodipine 5mg  daily - Start losartan 25mg  daily  #Chronic kidney disease likely stage IIIb  Renal function stable over last two days. Per his duplicate chart, his creatinine at that time was ~2.2. Likely this is patient's baseline. Adding ARB today for blood pressure control. - Daily BMP - I/O - Avoid nephrotoxins  #HIV #Hepatitis B Patient reports history of both HIV and hepatitis B. Unfortunately, we do not have any records for him available. Will consult ID to help manage patient and start him on appropriate anti-virals. - Pending HIV viral  load, CD4, HBsAg, HBsAb - Appreciate ID assistance and recommendations  #Peripheral neuropathy He has previously tried gabapentin for this issue but does not want to re-start due to adverse effects. Yesterday started patient on pregabalin, appears to be working well as he reports improvement of symptoms.  - Continue pregabalin 75mg  TID  #Pancytopenia All cell lines down for the past two days.  Patient likely has a chronic anemia due to chronic kidney disease. Possibly thrombocytopenia and leukopenia due to his immunocompromised status 2/2 HIV. This is most likely a chronic issue, currently not exhibiting any worrisome signs or symptoms.  - Daily CBC  #Polysubstance use disorder UDS positive for THC and cocaine on arrival. Expect his psychiatric disorders likely contributing to use.  DIET: HH IVF: n/a DVT PPX: Lovenox BOWEL: Senokot-S CODE: FULL FAM COM: none - does not wish for his family to be contacted  Prior to Admission Living Arrangement: Unhomed Anticipated Discharge Location: [facility] Barriers to Discharge: [facility] placement Dispo: Anticipated discharge in approximately 1-2 day(s).   [facility], MD 02/25/2021, 6:16 AM Pager: [phone] After 5pm on weekdays and 1pm on weekends: On Call pager [phone]
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
## 2021-02-26 ENCOUNTER — Other Ambulatory Visit (HOSPITAL_COMMUNITY): Payer: Self-pay

## 2021-02-26 DIAGNOSIS — N189 Chronic kidney disease, unspecified: Secondary | ICD-10-CM

## 2021-02-26 LAB — BASIC METABOLIC PANEL
Anion gap: 5 (ref 5–15)
BUN: 23 mg/dL — ABNORMAL HIGH (ref 6–20)
CO2: 26 mmol/L (ref 22–32)
Calcium: 9.5 mg/dL (ref 8.9–10.3)
Chloride: 106 mmol/L (ref 98–111)
Creatinine, Ser: 2.34 mg/dL — ABNORMAL HIGH (ref 0.61–1.24)
GFR, Estimated: 34 mL/min — ABNORMAL LOW (ref >60)
Glucose, Bld: 116 mg/dL — ABNORMAL HIGH (ref 70–99)
Potassium: 3.8 mmol/L (ref 3.5–5.1)
Sodium: 137 mmol/L (ref 135–145)

## 2021-02-26 LAB — HEPATITIS PANEL, ACUTE
HCV Ab: NONREACTIVE
Hep A IgM: NONREACTIVE
Hep B C IgM: NONREACTIVE
Hepatitis B Surface Ag: REACTIVE — AB

## 2021-02-26 LAB — CBC
HCT: 37.7 % — ABNORMAL LOW (ref 39.0–52.0)
Hemoglobin: 11.3 g/dL — ABNORMAL LOW (ref 13.0–17.0)
MCH: 24.1 pg — ABNORMAL LOW (ref 26.0–34.0)
MCHC: 30 g/dL (ref 30.0–36.0)
MCV: 80.6 fL (ref 80.0–100.0)
Platelets: 134 10*3/uL — ABNORMAL LOW (ref 150–400)
RBC: 4.68 MIL/uL (ref 4.22–5.81)
RDW: 14.6 % (ref 11.5–15.5)
WBC: 4.5 10*3/uL (ref 4.0–10.5)
nRBC: 0 % (ref 0.0–0.2)

## 2021-02-26 LAB — HIV-1 RNA QUANT-NO REFLEX-BLD
HIV 1 RNA Quant: 72600 copies/mL
LOG10 HIV-1 RNA: 4.861 log10copy/mL

## 2021-02-26 LAB — HIV-1/2 AB - DIFFERENTIATION
HIV 1 Ab: REACTIVE — AB
HIV 2 Ab: UNDETERMINED

## 2021-02-26 MED ORDER — SENNOSIDES-DOCUSATE SODIUM 8.6-50 MG PO TABS
2.0000 | ORAL_TABLET | Freq: Every day | ORAL | Status: DC
  Administered 2021-02-26: 2 via ORAL
  Filled 2021-02-26: qty 2

## 2021-02-26 MED ORDER — POLYETHYLENE GLYCOL 3350 17 G PO PACK
17.0000 g | PACK | Freq: Every day | ORAL | Status: DC
  Administered 2021-02-26 – 2021-03-05 (×8): 17 g via ORAL
  Filled 2021-02-26 (×7): qty 1

## 2021-02-26 MED ORDER — LORAZEPAM 2 MG/ML IJ SOLN
1.0000 mg | INTRAMUSCULAR | Status: DC | PRN
  Administered 2021-02-26 – 2021-03-01 (×6): 2 mg via INTRAVENOUS
  Filled 2021-02-26 (×8): qty 1

## 2021-02-26 NOTE — Plan of Care (Signed)
  Problem: Clinical Measurements: Goal: Diagnostic test results will improve Outcome: Completed/Met Goal: Respiratory complications will improve Outcome: Completed/Met Goal: Cardiovascular complication will be avoided Outcome: Completed/Met   Problem: Activity: Goal: Risk for activity intolerance will decrease Outcome: Completed/Met   Problem: Nutrition: Goal: Adequate nutrition will be maintained Outcome: Completed/Met

## 2021-02-26 NOTE — Progress Notes (Signed)
Per Caryn Bee, FNP patient meets inpatient psychiatric criteria.  CSW faxed patient's information to the following facilities for review:  [facility]      [facility]      [facility]      [facility]      [facility]      [facility]      [facility]      [facility]      Edwin Dada, MSW, LCSW Transitions of Care  Clinical Social Worker II [phone]

## 2021-02-26 NOTE — TOC Progression Note (Addendum)
Transition of Care [facility]) - Progression Note    Patient Details  Name: Boruch Manuele MRN: [id number] Date of Birth: [birth year]  Transition of Care [facility]) CM/SW Contact  Okey Dupre Lazaro Arms, LCSW Phone Number: 02/26/2021, 12:59 PM  Clinical Narrative:  Inpatient psychiatric placement recommended and patient is now medically stable for discharge. Called [place], [place] [phone]) at [facility] and referral made for inpatient psych placement. Casimiro Needle indicated that he will add people to the secure chat CSW sent to him.  Call made to [place] [phone]) and CSW was advised to send secure chat to Gilford Rile, Les Pou, MD and Dr. Mordecai Rasmussen. Referral made to inpatient psych placement by secure chat.   12:37 pm: Received a secure chat from Dr. Toni Amend indicating that they [facility] are currently full.       Expected Discharge Plan and Services  Inpatient psychiatric placement                                             Social Determinants of Health (SDOH) Interventions  Patient needs mental health treatment  Readmission Risk Interventions No flowsheet data found.

## 2021-02-26 NOTE — Progress Notes (Signed)
   Subjective:  HD3  Patient seen at bedside this morning. He does note that he was having auditory hallucinations last night that consisted of voices. Denies current suicidal or homicidal ideation. No acute changes.  Please note patient's name is Jesus Roberson. There is a duplicate chart: MRN [id number].  Objective:  Vital signs in last 24 hours: Vitals:   02/25/21 2045 02/26/21 0321 02/26/21 0400 02/26/21 0536  BP: 126/90 (!) 131/93    Pulse: 73 69    Resp: 20 18    Temp: 98.9 F (37.2 C) 98.2 F (36.8 C)    TempSrc: Oral     SpO2: 97% 98% 98%   Weight:    84.1 kg  Height:       Physical Exam: General: Laying in bed comfortably, no acute distress CV: Regular rate, rhythm. No murmurs, rubs, gallops.  Pulm: Normal work of breathing on room air. Abdomen: Soft, non-tender, non-distended. Normoactive bowel sounds. MSK: Normal bulk, tone. BLE non edematous. Pscyh: Normal speech, mood.  Assessment/Plan: Jesus Roberson is 44yo person with schizophrenia, uncontrolled HIV, CKDIIIb, hx seizures, peripheral neuropathy, history of hepatitis B admitted 5/21 with suicide attempt and acute kidney injury, medically stable pending [facility] placement.   Principal Problem:   Schizophrenia (HCC) Active Problems:   AKI (acute kidney injury) (HCC)   MDD (major depressive disorder), recurrent episode, severe (HCC)   Polysubstance abuse (HCC)   Suicidal intent  #Suicide attempt #Schizophrenia This morning patient endorsing auditory hallucinations the night prior. Currently denies any suicidal or homicidal thoughts. Will benefit greatly from inpatient psychiatry. Believe this will allow him to stabilize mood and hopefully settle his housing situation. He has been medically stable since Sunday, May 22nd, pending [facility] placement. Of note, was called this afternoon for increasing agitation, please see note from this afternoon for further details.  - Continue olanzapine 5mg  BID - Agitation orders as needed - 1:1  sitter - Pending [facility] placement  #Hypertension Blood pressures under better control overnight. Will continue with current regimen. - Continue amlodipine, losartan  #Uncontrolled HIV #History hepatitis B CD4+ 196, viral load still pending. Patient has not been on antiviral in at least 8 months. Discussed with ID, starting on Descovy on Tivicay. Can also consider PJP prophylaxis as CD4 <200. Given he has CKD and is on ARB, can consider dapsone in lieu of Bactrim. Hepatitis B surface antigen positive, will follow-up with lab work. 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 Will continue monitoring. - Daily BMP - I/O - Avoid nephrotoxins  #Constipation Patient reports constipation this afternoon, has not had BM since having Senokot-S. - Senokot-S 2 tablets QHS - Miralax daily  DIET: HH IVF: n/a DVT PPX: Lovenox BOWEL: Miralax, Senokot-S CODE: FULL FAM COM: n/a  Prior to Admission Living Arrangement: Home Anticipated Discharge Location: [facility] Barriers to Discharge: [facility] placement Dispo: Anticipated discharge in approximately 2-3 day(s).   [facility], MD 02/26/2021, 7:29 AM Pager: [phone] After 5pm on weekdays and 1pm on weekends: On Call pager [phone]

## 2021-02-26 NOTE — Progress Notes (Signed)
CLINICAL UPDATE:  RN messaged primary team reporting that Jesus Roberson is requesting to speak to MD, as he wants to leave. Per RN patient also stating he has things he has to tend to at home, and that he does not want to cause [place] any problems.  At that time I went to the floor to speak to the RN as well as Facilities manager, Jesus Roberson. It was then decided to have security come to bedside for safety of all providers and patient.  Upon entering the room, patient was sitting on the side of the bed, clearly agitated. He repeatedly stated that he needed to find his possessions, and in order to do that, he needs to leave the hospital. Multiple attempts were made by myself and security to explain that he was not allowed to leave the hospital at this time. We attempted to calm the patient down, including offering to try to find his possessions. He stated that he feels "locked up" and that if he stays in the hospital room, "you better not have a nurse in here with me." Further explained that he would remain naked and act as if he was incarcerated. Patient requesting to talk to psychiatry, as he is not currently endorsing suicidal or homicidal ideation. Explained that this likely will not be able to occur until tomorrow. Patient continued to be agitated, including yelling and threatening security. The decision was made that in order to provide a safe environment for the patient and staff that he will require chemical restraints. Patient eventually agreeable to taking the medication once security left the room. At that time IV Ativan 2mg  was given.   Discussed with psychiatry, who agrees to see patient tomorrow. Will pass this information along to the on call team.  - Psychiatry to see tomorrow - Agitation orders in place - Continue olanzapine 5mg  BID  , MD Internal Medicine PGY-1 [phone]

## 2021-02-26 NOTE — Progress Notes (Addendum)
Subjective: No new complaints this am when I visited him   Antibiotics:  Anti-infectives (From admission, onward)   Start     Dose/Rate Route Frequency Ordered Stop   02/25/21 1400  dolutegravir (TIVICAY) tablet 50 mg        50 mg Oral Daily 02/25/21 1256     02/25/21 1400  emtricitabine-tenofovir AF (DESCOVY) 200-25 MG per tablet 1 tablet        1 tablet Oral Daily 02/25/21 1256        Medications: Scheduled Meds: . acetaminophen  650 mg Oral Once  . amLODipine  5 mg Oral Daily  . dolutegravir  50 mg Oral Daily  . emtricitabine-tenofovir AF  1 tablet Oral Daily  . enoxaparin (LOVENOX) injection  40 mg Subcutaneous Q24H  . ibuprofen  600 mg Oral Once  . LORazepam  1 mg Oral Once  . LORazepam  1 mg Oral Once  . losartan  25 mg Oral Daily  . OLANZapine zydis  5 mg Oral BID  . polyethylene glycol  17 g Oral Daily  . pregabalin  75 mg Oral TID  . senna-docusate  2 tablet Oral QHS  . sodium chloride flush  3 mL Intravenous Q12H   Continuous Infusions: PRN Meds:.acetaminophen **OR** acetaminophen, LORazepam, OLANZapine zydis **AND** [COMPLETED] LORazepam **AND** ziprasidone    Objective: Weight change: 1 kg  Intake/Output Summary (Last 24 hours) at 02/26/2021 1612 Last data filed at 02/26/2021 1100 Gross per 24 hour  Intake 1646 ml  Output --  Net 1646 ml   Blood pressure 137/87, pulse 77, temperature 99.1 F (37.3 C), temperature source Oral, resp. rate 18, height 5\' 7"  (1.702 m), weight 84.1 kg, SpO2 98 %. Temp:  [98.2 F (36.8 C)-99.1 F (37.3 C)] 99.1 F (37.3 C) (05/24 0931) Pulse Rate:  [69-77] 77 (05/24 0931) Resp:  [18-20] 18 (05/24 0931) BP: (126-137)/(87-93) 137/87 (05/24 0931) SpO2:  [97 %-98 %] 98 % (05/24 0931) Weight:  [84.1 kg] 84.1 kg (05/24 0536)  Physical Exam: Physical Exam Constitutional:      Appearance: He is well-developed.  HENT:     Head: Normocephalic and atraumatic.  Eyes:     Conjunctiva/sclera: Conjunctivae normal.   Cardiovascular:     Rate and Rhythm: Normal rate and regular rhythm.  Pulmonary:     Effort: Pulmonary effort is normal. No respiratory distress.     Breath sounds: No wheezing.  Abdominal:     General: There is no distension.     Palpations: Abdomen is soft.  Musculoskeletal:        General: Normal range of motion.     Cervical back: Normal range of motion and neck supple.  Skin:    General: Skin is warm and dry.     Findings: No erythema or rash.  Neurological:     Mental Status: He is alert and oriented to person, place, and time.  Psychiatric:        Attention and Perception: Attention normal.        Mood and Affect: Affect is flat.        Speech: Speech is delayed.        Behavior: Behavior is slowed.        Thought Content: Thought content is delusional.        Judgment: Judgment normal.      CBC:    BMET Recent Labs    02/25/21 0714 02/26/21 0451  NA 139 137  K 4.0 3.8  CL 107 106  CO2 26 26  GLUCOSE 86 116*  BUN 27* 23*  CREATININE 2.34* 2.34*  CALCIUM 9.3 9.5     Liver Panel  No results for input(s): PROT, ALBUMIN, AST, ALT, ALKPHOS, BILITOT, BILIDIR, IBILI in the last 72 hours.     Sedimentation Rate No results for input(s): ESRSEDRATE in the last 72 hours. C-Reactive Protein No results for input(s): CRP in the last 72 hours.  Micro Results: Recent Results (from the past 720 hour(s))  Resp Panel by RT-PCR (Flu A&B, Covid) Nasopharyngeal Swab     Status: None   Collection Time: 02/23/21  5:23 AM   Specimen: Nasopharyngeal Swab; Nasopharyngeal(NP) swabs in vial transport medium  Result Value Ref Range Status   SARS Coronavirus 2 by RT PCR NEGATIVE NEGATIVE Final    Comment: (NOTE) SARS-CoV-2 target nucleic acids are NOT DETECTED.  The SARS-CoV-2 RNA is generally detectable in upper respiratory specimens during the acute phase of infection. The lowest concentration of SARS-CoV-2 viral copies this assay can detect is 138 copies/mL. A  negative result does not preclude SARS-Cov-2 infection and should not be used as the sole basis for treatment or other patient management decisions. A negative result may occur with  improper specimen collection/handling, submission of specimen other than nasopharyngeal swab, presence of viral mutation(s) within the areas targeted by this assay, and inadequate number of viral copies(<138 copies/mL). A negative result must be combined with clinical observations, patient history, and epidemiological information. The expected result is Negative.  Fact Sheet for Patients:  BloggerCourse.com  Fact Sheet for Healthcare Providers:  SeriousBroker.it  This test is no t yet approved or cleared by the [place] FDA and  has been authorized for detection and/or diagnosis of SARS-CoV-2 by FDA under an Emergency Use Authorization (EUA). This EUA will remain  in effect (meaning this test can be used) for the duration of the COVID-19 declaration under Section 564(b)(1) of the Act, 21 U.S.C.section 360bbb-3(b)(1), unless the authorization is terminated  or revoked sooner.       Influenza A by PCR NEGATIVE NEGATIVE Final   Influenza B by PCR NEGATIVE NEGATIVE Final    Comment: (NOTE) The Xpert Xpress SARS-CoV-2/FLU/RSV plus assay is intended as an aid in the diagnosis of influenza from Nasopharyngeal swab specimens and should not be used as a sole basis for treatment. Nasal washings and aspirates are unacceptable for Xpert Xpress SARS-CoV-2/FLU/RSV testing.  Fact Sheet for Patients: BloggerCourse.com  Fact Sheet for Healthcare Providers: SeriousBroker.it  This test is not yet approved or cleared by the [place] FDA and has been authorized for detection and/or diagnosis of SARS-CoV-2 by FDA under an Emergency Use Authorization (EUA). This EUA will remain in effect (meaning this test can  be used) for the duration of the COVID-19 declaration under Section 564(b)(1) of the Act, 21 U.S.C. section 360bbb-3(b)(1), unless the authorization is terminated or revoked.  Performed at [facility], [address], [place], [place] [address]     Studies/Results: No results found.    Assessment/Plan:  INTERVAL HISTORY: TIVICAY and DESCOVY restarted   Principal Problem:   Schizophrenia (HCC) Active Problems:   AKI (acute kidney injury) (HCC)   MDD (major depressive disorder), recurrent episode, severe (HCC)   Polysubstance abuse (HCC)   Suicidal intent    Jesus Roberson is a 45 y.o. male with HIV and hepatitis B coinfection who is been off medications also with comorbid schizophrenia that is not been  well controlled, chronic kidney disease admitted after been found holding a knife and threatening to kill himself also has been voicing homicidal ideation.  This morning he was relatively calm when I saw him but I just read the notes from later's afternoon when he became quite agitated and had to be given Ativan to calm down.  1.  HIV disease:  Continue TIVICAY and DESCOVY  Could place him on dapsone for PCP prevention provided G6PD level has been checked all are to tentatively use single strength Bactrim daily monitor renal function  We are endeavoring to get him enrolled into [facility]  His psychiatric problems may pose a significant barrier to care.  2.  Schizophrenia with significant psychiatric problems with suicidal ideation homicidal ideation and substance abuse: he had to be chemically restrained today as he was very agitated.  I am wondering how he is going to remain stable long term after his admission to Behavioral Health  Please have the inpatient team at [facility] reach out to [place] once DC is known so we can schedule a hospital followup with [place] within 7 days of leaving inpatient unit  I will tentatively schedule him to see [place] in 3 weeks  I spent greater than  435 minutes with the patient including greater than 50% of time in face to face counsel of the patient and in coordination of his care.   Kayde Aroro Brass has an appointment on 03/14/2021 at 3pm with Rexene Alberts  He should arrive 15-30 minutes prior to his appointment.  [facility] is located in the [facility] at  [address] [place] in [place].  [address], which is located to the left of the elevators.  Phone: [phone]  Fax: [phone]  [email or web address]       LOS: 2 days   Acey Lav 02/26/2021, 4:12 PM

## 2021-02-26 NOTE — Progress Notes (Signed)
Patient requested to speak with the MD and reported that he needs to home immediately, patient became very agitated, MD was notified. Security and MD arrived at bedside, patient became increasingly more agitated and stated "don't touch me" to police and staff as they attempted to deescalate the situation. Patient finally agreed to take ativan medication. RN will continue to monitor this patient.

## 2021-02-26 NOTE — TOC Benefit Eligibility Note (Signed)
Patient Advocate Encounter  Completed and sent [employer] Advancing Access application for Descovy for this patient who is uninsured.    Patient is approved.  BIN      [id number] PCN    [id number] [id number]    [id number] ID        [id number]    Roland Earl, CPhT Pharmacy Patient Advocate Specialist [place] [facility] Team Direct Number: [phone]  Fax: [phone]

## 2021-02-27 LAB — BASIC METABOLIC PANEL
Anion gap: 6 (ref 5–15)
BUN: 22 mg/dL — ABNORMAL HIGH (ref 6–20)
CO2: 28 mmol/L (ref 22–32)
Calcium: 10.4 mg/dL — ABNORMAL HIGH (ref 8.9–10.3)
Chloride: 103 mmol/L (ref 98–111)
Creatinine, Ser: 2.48 mg/dL — ABNORMAL HIGH (ref 0.61–1.24)
GFR, Estimated: 32 mL/min — ABNORMAL LOW (ref >60)
Glucose, Bld: 102 mg/dL — ABNORMAL HIGH (ref 70–99)
Potassium: 3.9 mmol/L (ref 3.5–5.1)
Sodium: 137 mmol/L (ref 135–145)

## 2021-02-27 LAB — CBC
HCT: 38.5 % — ABNORMAL LOW (ref 39.0–52.0)
Hemoglobin: 11.3 g/dL — ABNORMAL LOW (ref 13.0–17.0)
MCH: 23.9 pg — ABNORMAL LOW (ref 26.0–34.0)
MCHC: 29.4 g/dL — ABNORMAL LOW (ref 30.0–36.0)
MCV: 81.4 fL (ref 80.0–100.0)
Platelets: 125 10*3/uL — ABNORMAL LOW (ref 150–400)
RBC: 4.73 MIL/uL (ref 4.22–5.81)
RDW: 14.5 % (ref 11.5–15.5)
WBC: 4.9 10*3/uL (ref 4.0–10.5)
nRBC: 0 % (ref 0.0–0.2)

## 2021-02-27 LAB — HEPATITIS B SURFACE ANTIBODY, QUANTITATIVE: Hep B S AB Quant (Post): <3.1 m[IU]/mL — ABNORMAL LOW (ref >9.9)

## 2021-02-27 MED ORDER — LORAZEPAM 2 MG/ML IJ SOLN
1.0000 mg | INTRAMUSCULAR | Status: AC
  Administered 2021-02-27: 2 mg via INTRAVENOUS
  Filled 2021-02-27: qty 1

## 2021-02-27 MED ORDER — LOSARTAN POTASSIUM 50 MG PO TABS: 50.0000 mg | ORAL_TABLET | Freq: Every day | ORAL | Status: DC

## 2021-02-27 MED ORDER — SENNOSIDES-DOCUSATE SODIUM 8.6-50 MG PO TABS
2.0000 | ORAL_TABLET | Freq: Two times a day (BID) | ORAL | Status: DC
  Administered 2021-02-27 – 2021-03-05 (×12): 2 via ORAL
  Filled 2021-02-27 (×11): qty 2

## 2021-02-27 MED ORDER — OLANZAPINE 5 MG PO TBDP
10.0000 mg | ORAL_TABLET | Freq: Two times a day (BID) | ORAL | Status: DC
  Administered 2021-02-27 – 2021-03-05 (×12): 10 mg via ORAL
  Filled 2021-02-27 (×12): qty 2

## 2021-02-27 MED ORDER — OLANZAPINE 5 MG PO TBDP
5.0000 mg | ORAL_TABLET | Freq: Once | ORAL | Status: AC
  Administered 2021-02-27: 5 mg via ORAL
  Filled 2021-02-27: qty 1

## 2021-02-27 NOTE — TOC Benefit Eligibility Note (Signed)
Patient Advocate Encounter  Patient is approved through the [employer] Patient Assistance Program for Tivicay through 02/26/2022.   Assistance Phone Number is [phone].  Medication will be sent to [facility] to give patient at discharge.   Roland Earl, CPhT Pharmacy Patient Advocate Specialist [place] [facility] Team Direct Number: [phone]  Fax: [phone]

## 2021-02-27 NOTE — Progress Notes (Addendum)
Subjective: He is complaining of a cough and also wanting to be able to go back to work to earn money claims that his boss has been calling him.   Antibiotics:  Anti-infectives (From admission, onward)   Start     Dose/Rate Route Frequency Ordered Stop   02/25/21 1400  dolutegravir (TIVICAY) tablet 50 mg        50 mg Oral Daily 02/25/21 1256     02/25/21 1400  emtricitabine-tenofovir AF (DESCOVY) 200-25 MG per tablet 1 tablet        1 tablet Oral Daily 02/25/21 1256        Medications: Scheduled Meds: . acetaminophen  650 mg Oral Once  . amLODipine  5 mg Oral Daily  . dolutegravir  50 mg Oral Daily  . emtricitabine-tenofovir AF  1 tablet Oral Daily  . enoxaparin (LOVENOX) injection  40 mg Subcutaneous Q24H  . ibuprofen  600 mg Oral Once  . LORazepam  1 mg Oral Once  . LORazepam  1 mg Oral Once  . [START ON 02/28/2021] losartan  50 mg Oral Daily  . OLANZapine zydis  5 mg Oral BID  . polyethylene glycol  17 g Oral Daily  . pregabalin  75 mg Oral TID  . senna-docusate  2 tablet Oral BID  . sodium chloride flush  3 mL Intravenous Q12H   Continuous Infusions: PRN Meds:.acetaminophen **OR** acetaminophen, LORazepam, OLANZapine zydis **AND** [COMPLETED] LORazepam **AND** ziprasidone    Objective: Weight change:   Intake/Output Summary (Last 24 hours) at 02/27/2021 1216 Last data filed at 02/27/2021 0846 Gross per 24 hour  Intake 1260 ml  Output 0 ml  Net 1260 ml   Blood pressure (!) 136/99, pulse 74, temperature 98.2 F (36.8 C), resp. rate 17, height 5\' 7"  (1.702 m), weight 84.1 kg, SpO2 96 %. Temp:  [98.2 F (36.8 C)-100.3 F (37.9 C)] 98.2 F (36.8 C) (05/25 0949) Pulse Rate:  [70-85] 74 (05/25 0949) Resp:  [17-20] 17 (05/25 0949) BP: (100-146)/(64-100) 136/99 (05/25 0949) SpO2:  [95 %-98 %] 96 % (05/25 0949)  Physical Exam: Physical Exam Constitutional:      Appearance: He is well-developed.  HENT:     Head: Normocephalic and atraumatic.  Eyes:      Conjunctiva/sclera: Conjunctivae normal.  Cardiovascular:     Rate and Rhythm: Normal rate and regular rhythm.  Pulmonary:     Effort: Pulmonary effort is normal. No respiratory distress.     Breath sounds: No wheezing.  Abdominal:     General: There is no distension.     Palpations: Abdomen is soft.  Musculoskeletal:        General: Normal range of motion.     Cervical back: Normal range of motion and neck supple.  Skin:    General: Skin is warm and dry.     Findings: No erythema or rash.  Neurological:     Mental Status: He is alert and oriented to person, place, and time.  Psychiatric:        Attention and Perception: Attention normal.        Mood and Affect: Affect is flat.        Speech: Speech is delayed.        Behavior: Behavior is slowed.        Thought Content: Thought content is delusional.        Judgment: Judgment normal.      CBC:    BMET Recent Labs  02/26/21 0451 02/27/21 0342  NA 137 137  K 3.8 3.9  CL 106 103  CO2 26 28  GLUCOSE 116* 102*  BUN 23* 22*  CREATININE 2.34* 2.48*  CALCIUM 9.5 10.4*     Liver Panel  No results for input(s): PROT, ALBUMIN, AST, ALT, ALKPHOS, BILITOT, BILIDIR, IBILI in the last 72 hours.     Sedimentation Rate No results for input(s): ESRSEDRATE in the last 72 hours. C-Reactive Protein No results for input(s): CRP in the last 72 hours.  Micro Results: Recent Results (from the past 720 hour(s))  Resp Panel by RT-PCR (Flu A&B, Covid) Nasopharyngeal Swab     Status: None   Collection Time: 02/23/21  5:23 AM   Specimen: Nasopharyngeal Swab; Nasopharyngeal(NP) swabs in vial transport medium  Result Value Ref Range Status   SARS Coronavirus 2 by RT PCR NEGATIVE NEGATIVE Final    Comment: (NOTE) SARS-CoV-2 target nucleic acids are NOT DETECTED.  The SARS-CoV-2 RNA is generally detectable in upper respiratory specimens during the acute phase of infection. The lowest concentration of SARS-CoV-2 viral  copies this assay can detect is 138 copies/mL. A negative result does not preclude SARS-Cov-2 infection and should not be used as the sole basis for treatment or other patient management decisions. A negative result may occur with  improper specimen collection/handling, submission of specimen other than nasopharyngeal swab, presence of viral mutation(s) within the areas targeted by this assay, and inadequate number of viral copies(<138 copies/mL). A negative result must be combined with clinical observations, patient history, and epidemiological information. The expected result is Negative.  Fact Sheet for Patients:  BloggerCourse.com  Fact Sheet for Healthcare Providers:  SeriousBroker.it  This test is no t yet approved or cleared by the [place] FDA and  has been authorized for detection and/or diagnosis of SARS-CoV-2 by FDA under an Emergency Use Authorization (EUA). This EUA will remain  in effect (meaning this test can be used) for the duration of the COVID-19 declaration under Section 564(b)(1) of the Act, 21 U.S.C.section 360bbb-3(b)(1), unless the authorization is terminated  or revoked sooner.       Influenza A by PCR NEGATIVE NEGATIVE Final   Influenza B by PCR NEGATIVE NEGATIVE Final    Comment: (NOTE) The Xpert Xpress SARS-CoV-2/FLU/RSV plus assay is intended as an aid in the diagnosis of influenza from Nasopharyngeal swab specimens and should not be used as a sole basis for treatment. Nasal washings and aspirates are unacceptable for Xpert Xpress SARS-CoV-2/FLU/RSV testing.  Fact Sheet for Patients: BloggerCourse.com  Fact Sheet for Healthcare Providers: SeriousBroker.it  This test is not yet approved or cleared by the [place] FDA and has been authorized for detection and/or diagnosis of SARS-CoV-2 by FDA under an Emergency Use Authorization (EUA). This  EUA will remain in effect (meaning this test can be used) for the duration of the COVID-19 declaration under Section 564(b)(1) of the Act, 21 U.S.C. section 360bbb-3(b)(1), unless the authorization is terminated or revoked.  Performed at [facility], [address] [place] [address] [place], [place] [address]     Studies/Results: No results found.    Assessment/Plan:  INTERVAL HISTORY:   Patient was agitated yesterday   Principal Problem:   Schizophrenia (HCC) Active Problems:   Acute kidney injury (HCC)   MDD (major depressive disorder), recurrent episode, severe (HCC)   Polysubstance abuse (HCC)   Suicidal intent    Jesus Roberson is a 45 y.o. male with HIV and hepatitis B coinfection who is been  off medications also with comorbid schizophrenia that is not been well controlled, chronic kidney disease admitted after been found holding a knife and threatening to kill himself also has been voicing homicidal ideation.  This morning he was relatively calm when I saw him but I just read the notes from later's afternoon when he became quite agitated and had to be given Ativan to calm down.  1.  HIV disease:  Continue TIVICAY and DESCOVY  We will start dapsone for PCP prophylaxis his G6PD level is pending  We are endeavoring to get him enrolled into [facility]  His psychiatric problems may pose a significant barrier to care.  2.  Schizophrenia with significant psychiatric problems with suicidal ideation homicidal ideation and substance abuse: he had to be chemically restrained today as he was very agitated.  I am wondering how he is going to remain stable long term after his admission to Behavioral Health  Please have the inpatient team at [facility] reach out to [place] once DC is known so we can schedule a hospital followup with [place] within 7 days of leaving inpatient unit  I will tentatively schedule him to see [place] in 3 weeks  I spent greater than 35  minutes with the patient including  greater than 50% of time in face to face counsel of the patient review of his radiographic data, laboratory including microbiological data.   Jesus Roberson has an appointment on 03/14/2021 at 3pm with Rexene Alberts  He should arrive 15-30 minutes prior to his appointment.  [facility] is located in the [facility] at  [address] [place] in [place].  [address], which is located to the left of the elevators.  Phone: [phone]  Fax: [phone]  [email or web address]   I will sign off for now.  Please call with further questions.     LOS: 3 days   Acey Lav 02/27/2021, 12:16 PM

## 2021-02-27 NOTE — Clinical Social Work Note (Addendum)
  CSW was informed by [facility] LCSW Junious Silk that he sent patient's information to [facility].   Genelle Bal, MSW, LCSW Licensed Clinical Social Worker Clinical Social Work Department [employer] [phone]
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
## 2021-02-27 NOTE — Progress Notes (Signed)
Patient was irritable and agitated on 3 separate occasions security had to be contacted and their assistance requested at bedside as the patient would non compliant with taking medications and reported that he wants to leave the hospital immediately as he needed to go home and get his belongings. MD and psychiatry were both contacted as the patient requested to speak with them immediately also. Patient repeatedly went into the bathroom and shut the door as he chanted to himself loudly. RN educated patient, alerted MD and psychiatry, and will continue to monitor this patient.

## 2021-02-27 NOTE — Progress Notes (Signed)
Signed       Signed         Show:Clear all [x] Manual[x] Template[] Copied  Added by: [x] , RN   [] Hover for details  SATURATION QUALIFICATIONS: (This note is used to comply with regulatory documentation for home oxygen)  Patient Saturations on Room Air at Rest = 100%  Patient Saturations on Room Air while Ambulating = 96%  Please briefly explain why patient needs home oxygen: Patient does not require home oxygen

## 2021-02-27 NOTE — Progress Notes (Addendum)
Patient was non compliant with taking morning medications at scheduled time and reported that he wants to take them a later, RN educated patient and will continue to monitor this patient.
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
## 2021-02-27 NOTE — Progress Notes (Signed)
Subjective:  HD4  Patient seen at bedside this morning. Denies further hallucinations, suicidal ideations, or homicidal ideations. Persistent that he needs to leave in order to get his belongings. Discussed that psychiatry will stop by today to talk with him for further evaluation.  Please note patient's name is Jesus Roberson. There is a duplicate chart: MRN [id number].  Objective:  Vital signs in last 24 hours: Vitals:   02/26/21 0931 02/26/21 1832 02/26/21 2045 02/27/21 0319  BP: 137/87 100/64 (!) 143/100 (!) 141/89  Pulse: 77 85 80 70  Resp: 18 18 20 20   Temp: 99.1 F (37.3 C) 98.6 F (37 C) 99.9 F (37.7 C) 98.5 F (36.9 C)  TempSrc: Oral Oral Oral Oral  SpO2: 98% 98% 98% 98%  Weight:      Height:       Physical Exam: General: Resting comfortably in bed in no acute distress CV: Regular rate, rhythm. No murmurs, rubs, gallops. Pulm: Normal work of breathing on room air. Abdomen: Soft, non-tender, non-distended. Normoactive bowel sounds. Psych: Flat affect.   CBC Latest Ref Rng & Units 02/27/2021 02/26/2021 02/25/2021  WBC 4.0 - 10.5 K/uL 4.9 4.5 3.8(L)  Hemoglobin 13.0 - 17.0 g/dL 11.3(L) 11.3(L) 11.2(L)  Hematocrit 39.0 - 52.0 % 38.5(L) 37.7(L) 37.7(L)  Platelets 150 - 400 K/uL 125(L) 134(L) 122(L)   BMP Latest Ref Rng & Units 02/27/2021 02/26/2021 02/25/2021  Glucose 70 - 99 mg/dL 02/27/2021) 591(M) 86  BUN 6 - 20 mg/dL 384(Y) 65(L) 93(T)  Creatinine 0.61 - 1.24 mg/dL 70(V) 7.79(T) 9.03(E)  Sodium 135 - 145 mmol/L 137 137 139  Potassium 3.5 - 5.1 mmol/L 3.9 3.8 4.0  Chloride 98 - 111 mmol/L 103 106 107  CO2 22 - 32 mmol/L 28 26 26   Calcium 8.9 - 10.3 mg/dL 10.4(H) 9.5 9.3   Assessment/Plan: Azel Gumina is 45yo person with schizophrenia, uncontrolled HIV, CKDIIIb, peripheral neuropathy, history of seizures and hepatitis B admitted 5/21 with suicide attempt and acute kidney injury, continues to be medically stable pending placement with inpatient psychiatry.  Principal  Problem:   Schizophrenia (HCC) Active Problems:   AKI (acute kidney injury) (HCC)   MDD (major depressive disorder), recurrent episode, severe (HCC)   Polysubstance abuse (HCC)   Suicidal intent  #Suicidal intention #Schizophrenia Yesterday patient had episode of agitation, reporting that he is not currently endorsing homicidal or suicidal ideation and wishes to go home. Re-consulted psychiatry, who saw patient this morning. Agree with psych, I believe patient would be best served in an inpatient psychiatry setting. He continues to be medically stable. Appears [facility] has no beds available, will need to be aggressive with referrals to find a bed. - Olanzapine 5mg  BID - Agitation orders PRN - 1:1 sitter - Pending inpatient psychiatry placement  #Uncontrolled HIV #Hepatitis B CD4 196 w/ viral load >72000. Per infectious disease, will continue with Tivicay and Descovy. Pending G6PD level to initiate dapsone for PJP prophylaxis. If normal will start dapsone 50mg  daily. - Tivicay, Descovy - Follow-up hepatitis B labs - Follow-up G6PD - Appreciate ID assistance.  #Hypertension BP above goal, will increase losartan. Patient likely will benefit from long term ARB due to CKD. - Increase losartan 50mg  daily - Amlodipine 5mg  daily  #Chronic kidney disease likely stage IIIb Renal function continues to be at baseline. Will continue to encourage adequate po intake. - Daily BMP - I/O - Avoid nephrotoxins  #Constipation Patient says he continues to be constipated, although did have small BM this morning. He  takes herbal supplement at home for constipation. - Senokot-S 2 tablets BID - Miralax daily  DIET: HH IVF: n/a DVT PPX: Lovenox BOWEL: Miralax, Senokot-S CODE: FULL FAM COM: n/a  Prior to Admission Living Arrangement: Home Anticipated Discharge Location: inpatient psychiatry Barriers to Discharge: placement Dispo: Anticipated discharge in approximately 2-3 day(s).   Evlyn Kanner, MD 02/27/2021, 6:24 AM Pager: [phone] After 5pm on weekdays and 1pm on weekends: On Call pager [phone]

## 2021-02-28 LAB — CBC
HCT: 35.6 % — ABNORMAL LOW (ref 39.0–52.0)
Hemoglobin: 10.3 g/dL — ABNORMAL LOW (ref 13.0–17.0)
MCH: 23.6 pg — ABNORMAL LOW (ref 26.0–34.0)
MCHC: 28.9 g/dL — ABNORMAL LOW (ref 30.0–36.0)
MCV: 81.7 fL (ref 80.0–100.0)
Platelets: 113 10*3/uL — ABNORMAL LOW (ref 150–400)
RBC: 4.36 MIL/uL (ref 4.22–5.81)
RDW: 14.3 % (ref 11.5–15.5)
WBC: 7.1 10*3/uL (ref 4.0–10.5)
nRBC: 0 % (ref 0.0–0.2)

## 2021-02-28 LAB — GLUCOSE 6 PHOSPHATE DEHYDROGENASE
G6PDH: 1.3 U/g{Hb} — ABNORMAL LOW (ref 3.8–14.2)
Hemoglobin: 11.3 g/dL — ABNORMAL LOW (ref 13.0–17.7)

## 2021-02-28 LAB — BASIC METABOLIC PANEL
Anion gap: 6 (ref 5–15)
BUN: 22 mg/dL — ABNORMAL HIGH (ref 6–20)
CO2: 29 mmol/L (ref 22–32)
Calcium: 10.4 mg/dL — ABNORMAL HIGH (ref 8.9–10.3)
Chloride: 103 mmol/L (ref 98–111)
Creatinine, Ser: 2.47 mg/dL — ABNORMAL HIGH (ref 0.61–1.24)
GFR, Estimated: 32 mL/min — ABNORMAL LOW (ref >60)
Glucose, Bld: 79 mg/dL (ref 70–99)
Potassium: 4 mmol/L (ref 3.5–5.1)
Sodium: 138 mmol/L (ref 135–145)

## 2021-02-28 LAB — HEPATITIS B E ANTIGEN: Hep B E Ag: POSITIVE — AB

## 2021-02-28 LAB — HEPATITIS B DNA, ULTRAQUANTITATIVE, PCR
HBV DNA SERPL PCR-ACNC: 1680000 IU/mL
HBV DNA SERPL PCR-LOG IU: 6.225 log10 IU/mL

## 2021-02-28 LAB — HEPATITIS B E ANTIBODY: Hep B E Ab: NEGATIVE

## 2021-02-28 MED ORDER — LOSARTAN POTASSIUM 50 MG PO TABS
100.0000 mg | ORAL_TABLET | Freq: Every day | ORAL | Status: DC
  Administered 2021-02-28 – 2021-03-01 (×2): 100 mg via ORAL
  Filled 2021-02-28 (×2): qty 2

## 2021-02-28 MED ORDER — CALCIUM POLYCARBOPHIL 625 MG PO TABS
625.0000 mg | ORAL_TABLET | Freq: Every day | ORAL | Status: DC
  Administered 2021-02-28 – 2021-03-05 (×6): 625 mg via ORAL
  Filled 2021-02-28 (×6): qty 1

## 2021-02-28 NOTE — Progress Notes (Signed)
Subjective:  HD5  This morning patient was sleeping upon entering the room. Patient easily awoke, reports he is doing well. Had a small bowel movement earlier today but still feels constipated.   Please note patient's name is Jesus Roberson. There is a duplicate chart: MRN [id number].  Objective:  Vital signs in last 24 hours: Vitals:   02/27/21 0930 02/27/21 0949 02/27/21 2017 02/28/21 0353  BP: (!) 146/98 (!) 136/99 (!) 153/95 (!) 151/98  Pulse: 82 74 86 84  Resp: 18 17 (!) 21 18  Temp: 100.3 F (37.9 C) 98.2 F (36.8 C) 98.8 F (37.1 C) 98.6 F (37 C)  TempSrc:    Oral  SpO2: 95% 96% 100% 97%  Weight:      Height:       Physical Exam: General: Resting comfortably in bed in no acute distress CV: Regular rate, rhythm. No murmurs, rubs, gallops. Pulm: Normal work of breathing, clear to auscultation bilaterally. Abdomen: Soft, non-tender, non-distended. Normoactive bowel sounds. Neuro: Awake, alert, answering questions appropriately.  CBC Latest Ref Rng & Units 02/28/2021 02/27/2021 02/26/2021  WBC 4.0 - 10.5 K/uL 7.1 4.9 4.5  Hemoglobin 13.0 - 17.0 g/dL 10.3(L) 11.3(L) 11.3(L)  Hematocrit 39.0 - 52.0 % 35.6(L) 38.5(L) 37.7(L)  Platelets 150 - 400 K/uL 113(L) 125(L) 134(L)   BMP Latest Ref Rng & Units 02/28/2021 02/27/2021 02/26/2021  Glucose 70 - 99 mg/dL 79 536(I) 680(H)  BUN 6 - 20 mg/dL 21(Y) 24(M) 25(O)  Creatinine 0.61 - 1.24 mg/dL 0.37(C) 4.88(Q) 9.16(X)  Sodium 135 - 145 mmol/L 138 137 137  Potassium 3.5 - 5.1 mmol/L 4.0 3.9 3.8  Chloride 98 - 111 mmol/L 103 103 106  CO2 22 - 32 mmol/L 29 28 26   Calcium 8.9 - 10.3 mg/dL 10.4(H) 10.4(H) 9.5   Assessment/Plan: Brae Gartman is 44yo person with schizophrenia, uncontrolled HIV, CKDIIIb, peripheral neuropathy, hepatitis B, history of seizures admitted 5/21 with suicide attempt and acute kidney injury, now resolved, medically stable and pending inpatient psychiatry placement.  Principal Problem:   Schizophrenia  (HCC) Active Problems:   Acute kidney injury (HCC)   MDD (major depressive disorder), recurrent episode, severe (HCC)   Polysubstance abuse (HCC)   Suicidal intent  #Suicidal intention #Schizophrenia Per psychiatry, medications were adjusted yesterday. Appears patient cyclically becomes more agitated in afternoon times, can consider another long-acting antipsychotic for better control. Otherwise, he is medically stable and is pending a bed with inpatient psychiatry. Unfortunately there is a shortage of open beds at this time, which is delaying discharge. Appreciate psychiatry recommendations and CSW assistance. - Olanzapine 10mg  BID - Agitation orders PRN - 1:1 sitter - Pending inpatient psychiatry placement  #Uncontrolled HIV #Hepatitis B Hepatitis B viral load >1,600,000. Patient currently on Tivicay and Descovy, which will cover for both hepatitis B and HIV per infectious disease. Still pending G6PD, plan on starting dapsone if normal. - Tivicay, Descovy - Follow-up G6PD - Appreciate ID assistance  #Hypertension BP continues to be elevated w/ systolics in 150. Will up titrate ARB, continue to monitor. - Increase losartan 100mg  daily - Amlodipine 5mg  daily - Daily BMP  #Constipation Patient did have small bowel movement this morning. Will continue with this regimen and add fiber tablet.  - Senokot-S 2 tablets BID - Miralax daily - Fibercon 625mg  daily  DIET: HH IVF: n/a DVT PPX: Lovenox BOWEL: Miralax, Senokot-S CODE: FULL FAM COM: n/a  Prior to Admission Living Arrangement: Home Anticipated Discharge Location: inpatient psychiatry Barriers to Discharge: placement Dispo: Anticipated  discharge in approximately 2-3 day(s).   Evlyn Kanner, MD 02/28/2021, 7:40 AM Pager: [phone] After 5pm on weekdays and 1pm on weekends: On Call pager [phone]

## 2021-02-28 NOTE — Consult Note (Signed)
Jesus Roberson is a 45 y.o. 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 Patient states that he is doing ok " I fell good like I can go outside and play foot ball. I got my god son an outfit, but I cant give it to him because Im in here. I missed his birthday. Patient require multiple prn medications and security presence (x3) yesterday as he was becoming very agitated and aggressive. Today he appears to verbalize understanding of not being able to leave the unit "Im stuck in here and cant do anything." He continues to have difficulty with thought process and has  Flight of ideas, although he is not disorganized today. He denies suicidal ideations, homicidal ideations and or hallucinations.    -Psychiatry to continue to follow at this time. Unfortunately he continues to meet inpatient criteria as he has high risk factors to harm himself and or others. He has been noncompliant with his medications, and chart review continues to show he is declining and decompensating requiring need for inpatient. Patient with early decompensation of schizophrenia as evident by his precipitating events leading to admission in the hospital, ongoing paranoia, worsening symptoms, difficulty adapting to normal stressors, and previous medications no longer working.  -Spoke with SW via secure chat, to discuss importance of prioritizing inpatient referrals to include out of system referrals. Timeliness is important as we have limited resources that will be able to manage this patient and his ongoing psychiatric needs. He needs inpatient for acute hospital medication management, crisis stabilization, and decompensating schizophrenia.

## 2021-03-01 LAB — BASIC METABOLIC PANEL
Anion gap: 5 (ref 5–15)
BUN: 24 mg/dL — ABNORMAL HIGH (ref 6–20)
CO2: 30 mmol/L (ref 22–32)
Calcium: 10.3 mg/dL (ref 8.9–10.3)
Chloride: 105 mmol/L (ref 98–111)
Creatinine, Ser: 2.49 mg/dL — ABNORMAL HIGH (ref 0.61–1.24)
GFR, Estimated: 32 mL/min — ABNORMAL LOW (ref >60)
Glucose, Bld: 96 mg/dL (ref 70–99)
Potassium: 4.1 mmol/L (ref 3.5–5.1)
Sodium: 140 mmol/L (ref 135–145)

## 2021-03-01 MED ORDER — SULFAMETHOXAZOLE-TRIMETHOPRIM 400-80 MG PO TABS
1.0000 | ORAL_TABLET | Freq: Every day | ORAL | Status: DC
  Administered 2021-03-02: 1 via ORAL
  Filled 2021-03-01: qty 1

## 2021-03-01 MED ORDER — SULFAMETHOXAZOLE-TRIMETHOPRIM 400-80 MG PO TABS
1.0000 | ORAL_TABLET | Freq: Every day | ORAL | Status: DC
  Filled 2021-03-01: qty 1

## 2021-03-01 MED ORDER — METOPROLOL SUCCINATE ER 25 MG PO TB24
25.0000 mg | ORAL_TABLET | Freq: Every day | ORAL | Status: DC
  Administered 2021-03-02 – 2021-03-05 (×4): 25 mg via ORAL
  Filled 2021-03-01 (×5): qty 1

## 2021-03-01 MED ORDER — LOSARTAN POTASSIUM 50 MG PO TABS
50.0000 mg | ORAL_TABLET | Freq: Every day | ORAL | Status: DC
  Administered 2021-03-02 – 2021-03-05 (×4): 50 mg via ORAL
  Filled 2021-03-01 (×4): qty 1

## 2021-03-01 MED ORDER — OLANZAPINE 5 MG PO TBDP
5.0000 mg | ORAL_TABLET | Freq: Every day | ORAL | Status: DC
  Administered 2021-03-02: 5 mg via ORAL
  Filled 2021-03-01 (×3): qty 1

## 2021-03-01 NOTE — Consult Note (Addendum)
Jesus Roberson is a 45 y.o. male patient admitted with psychosis, erratic behavior.  Patient is observed eating ice cream. He continues to present as blunted, lack insight, poor judgment and unable to comprhend. He continues to ruminate about discharge and going home.  Patient reportedly told the nursing manager" I will get a bike and ride at home.  If I do not go today I am going to lose my job and lose my house. "  Patient is currently homeless, and does not have a home at this time.  Patient is also requiring multiple as needed medications, which requires security presents throughout the day.  There appears to be more behavioral outbursts in the afternoon.  In the morning patient is more pleasant, communicative, and showing some willingness to cooperate with medication compliance and treatment adherence.  However it does appear patient is becoming more frustrated, and growing increasingly agitated due to his extended length of time.  He refers to this hospitalization as prison, and notes he is not able to do anything at this time. He continues to have difficulty with thought process and has  Flight of ideas, although he is not disorganized today. He denies suicidal ideations, homicidal ideations and or hallucinations.    -Psychiatry to continue to follow at this time, he does appear to be responding to medication.  -At this time will add an afternoon dose of olanzapine Zydis 5 mg p.o. daily at 1400, to further target agitation, aggression, and behavioral outbursts during the afternoon.   - patient continues to exhibit unpredictable behaviors that requires a Recruitment consultant.   - Patient with early decompensation of schizophrenia as evident by his precipitating events leading to admission in the hospital, ongoing paranoia, worsening symptoms, difficulty adapting to normal stressors, and previous medications no longer working.  -He needs inpatient for acute hospital medication management, crisis  stabilization, and decompensating schizophrenia.  -Continue to work with [facility] for inpatient referral.  If no appropriate beds are available at [facility] please refer out of system.  Patient continues to remain very aggressive, requiring multiple as needed medication, and physical restraint consider referral to [facility].

## 2021-03-01 NOTE — TOC Progression Note (Addendum)
Transition of Care [facility]) - Progression Note    Patient Details  Name: Jesus Roberson MRN: [id number] Date of Birth: Nov 02, 1975  Transition of [facility]) CM/SW Contact  Okey Dupre Lazaro Arms, LCSW Phone Number: 03/01/2021, 3:56 PM  Clinical Narrative:  Patient is IVC'd and the Involuntary Commitment paperwork updated today and served by [facility]. The documents are in patient's chart. Next IVC update: 03/07/21.   Search ongoing for psychiatric placement for [name]. Followed up with facilities that were sent clinicals on the 24th.  1. [facility] [phone]) - Currently at capacity. 2. [facility] Mar [phone]) - Can't take self-pay patient's as they require $5,000 down payment 3. [facility] [phone]) - Intake person took CSW's name and number and indicated that      someone would call. 4. [facility] [phone]) - Per Noreene Larsson, the intake nurse is not there today, but will be back on      Monday. 5. [facility] [phone]) - Got voicemail (Intake) and left my name, patient's name and      reason for call and my phone number. 6. [facility] [phone]) - Dianna answered and informed CSW that      they are not taking outside admissions right now. CSW can call tomorrow between 8 am - 10 am       and ask for the on-call provider (MD) who is usually on the unit during this time. 7. [facility] [phone]) - Talked with Shay in intake and she could not find paperwork on      patient. She requested that CSW resend. Fax number [phone]. Clinicals faxed 5:14 pm   Search will continue for a psychiatric facility for [name].       Expected Discharge Plan and Services - Psychiatric inpatient facility.                                                Social Determinants of Health (SDOH) Interventions  Patient in need of inpatient psychiatric treatment.   Readmission Risk  Interventions No flowsheet data found.

## 2021-03-01 NOTE — Progress Notes (Signed)
Subjective:  HD6  Patient reports he is doing alright this morning. Does mention he had some fevers overnight. Denies dyspnea, cough, nausea, vomiting, abdominal pain. He does have some congestion that he says started in the hospital. He reports he still hasn't had bowel movement.  Please note patient's name is Jesus Roberson. There is a duplicate chart: MRN [id number].  Objective:  Vital signs in last 24 hours: Vitals:   02/28/21 1819 02/28/21 2043 03/01/21 0500 03/01/21 1009  BP: (!) 142/94 (!) 147/94  (!) 137/96  Pulse: 94 94  84  Resp: 20 18  17   Temp: (!) 100.5 F (38.1 C) (!) 100.6 F (38.1 C)  98.1 F (36.7 C)  TempSrc: Oral Oral  Oral  SpO2: 97% 97%  94%  Weight:   84.1 kg   Height:       Physical Exam: General: Laying in bed, no acute distress CV: Regular rate, rhythm. No murmurs, rubs, gallops. Pulm: Clear to auscultation bilaterally. Normal work of breathing. Abdomen: Soft, non-tender, non-distended. Normoactive bowel sounds. Skin: Warm, dry. Small scabs on right lower abdomen. No erythema or drainage present.  CBC Latest Ref Rng & Units 02/28/2021 02/27/2021 02/27/2021  WBC 4.0 - 10.5 K/uL 7.1 4.9 -  Hemoglobin 13.0 - 17.0 g/dL 10.3(L) 11.3(L) 11.3(L)  Hematocrit 39.0 - 52.0 % 35.6(L) 38.5(L) -  Platelets 150 - 400 K/uL 113(L) 125(L) -   BMP Latest Ref Rng & Units 03/01/2021 02/28/2021 02/27/2021  Glucose 70 - 99 mg/dL 96 79 03/01/2021)  BUN 6 - 20 mg/dL 505(L) 97(Q) 73(A)  Creatinine 0.61 - 1.24 mg/dL 19(F) 7.90(W) 4.09(B)  Sodium 135 - 145 mmol/L 140 138 137  Potassium 3.5 - 5.1 mmol/L 4.1 4.0 3.9  Chloride 98 - 111 mmol/L 105 103 103  CO2 22 - 32 mmol/L 30 29 28   Calcium 8.9 - 10.3 mg/dL 3.53(G 10.4(H) 10.4(H)   Assessment/Plan: Jesus Roberson is 45yo person with schizophrenia, uncontrolled HIV, CKDIIIb, peripheral neuropathy, hepatitis B, history of seizures admitted 5/21 with suicide attempt and acute kidney injury, now resolved and medically stable, pending  inpatient psychiatry bed.  Principal Problem:   Schizophrenia (HCC) Active Problems:   Acute kidney injury (HCC)   MDD (major depressive disorder), recurrent episode, severe (HCC)   Polysubstance abuse (HCC)   Suicidal intent  #Suicidal intention #Schizophrenia Appreciate psychiatry's assistance with this difficult case. Given he cyclically becomes more agitated in afternoons, will give another dose of zyprexa in afternoon. Biggest limiting factor to discharge is bed placement. Unfortunately, there is a shortage of open beds proving this to be difficult. - Zyprexa 10mg  BID + afternoon Zyprexa 5mg  - Agitation orders PRN - 1:1 sitter - Pending inpatient psychiatry placement  #Low-grade fevers Overnight patient did spike two low-grade fevers. No overt signs of infection on exam, denies dyspnea or dysuria although he is endorsing some congestion. Given he is immunocompromised, cultures were drawn and COVID swab to be completed today. He does have known hepatitis B infection. However, Zyprexa can cause temperature dysregulation which could be contributing. Will continue to monitor for further infectious signs. - Follow-up blood cultures - Follow-up COVID-19 swab - CBC in AM  #Uncontrolled HIV #Active hepatitis B infection CD4 196. Hepatitis B viral load >1,600,000. Given low G6PD will hold off dapsone. He is also on ARB w/ CKD, holding off Bactrim for PJP prophylaxis. He is borderline for needing this anyway, and hopeful with adequate anti-retroviral therapy will not need prophylaxis moving forward. Will continue with Tivicay  and Descovy, which will cover for both HIV and hepatitis B. - Tivicay, Descovy - Appreciate ID assistance  #Hypertension BP continues to be elevated despite titration of antihypertensives. Heart rate has remained 80-90's, will start beta blocker for further control. - Start metoprolol 25mg  daily - Losartan 50mg  daily - Amlodipine 5mg  daily - Daily  BMP  #Constipation Continuing with current regimen. Patient has previously stated he does not want enema or suppository. Can consider lactulose if no BM by tomorrow. - Senokot-S 2 tablets BID - Miralax daily - Fibercon 625mg  daily  DIET: HH IVF: n/a DVT PPX: Lovenox BOWEL: Miralax, Senokot-S, Fibercon CODE: FULL FAM COM: n/a  Prior to Admission Living Arrangement: Home Anticipated Discharge Location: inpatient psychiatry Barriers to Discharge: placement Dispo: Anticipated discharge in approximately >3 day(s).   , MD 03/01/2021, 1:16 PM Pager: [phone] After 5pm on weekdays and 1pm on weekends: On Call pager [phone]

## 2021-03-02 DIAGNOSIS — R509 Fever, unspecified: Secondary | ICD-10-CM

## 2021-03-02 DIAGNOSIS — F332 Major depressive disorder, recurrent severe without psychotic features: Secondary | ICD-10-CM

## 2021-03-02 DIAGNOSIS — K5900 Constipation, unspecified: Secondary | ICD-10-CM

## 2021-03-02 DIAGNOSIS — I10 Essential (primary) hypertension: Secondary | ICD-10-CM

## 2021-03-02 LAB — CBC
HCT: 36.9 % — ABNORMAL LOW (ref 39.0–52.0)
Hemoglobin: 10.6 g/dL — ABNORMAL LOW (ref 13.0–17.0)
MCH: 23.9 pg — ABNORMAL LOW (ref 26.0–34.0)
MCHC: 28.7 g/dL — ABNORMAL LOW (ref 30.0–36.0)
MCV: 83.1 fL (ref 80.0–100.0)
Platelets: 138 10*3/uL — ABNORMAL LOW (ref 150–400)
RBC: 4.44 MIL/uL (ref 4.22–5.81)
RDW: 14.6 % (ref 11.5–15.5)
WBC: 6.6 10*3/uL (ref 4.0–10.5)
nRBC: 0 % (ref 0.0–0.2)

## 2021-03-02 LAB — BASIC METABOLIC PANEL
Anion gap: 6 (ref 5–15)
BUN: 28 mg/dL — ABNORMAL HIGH (ref 6–20)
CO2: 29 mmol/L (ref 22–32)
Calcium: 10.6 mg/dL — ABNORMAL HIGH (ref 8.9–10.3)
Chloride: 102 mmol/L (ref 98–111)
Creatinine, Ser: 2.58 mg/dL — ABNORMAL HIGH (ref 0.61–1.24)
GFR, Estimated: 30 mL/min — ABNORMAL LOW (ref >60)
Glucose, Bld: 102 mg/dL — ABNORMAL HIGH (ref 70–99)
Potassium: 4.5 mmol/L (ref 3.5–5.1)
Sodium: 137 mmol/L (ref 135–145)

## 2021-03-02 MED ORDER — SULFAMETHOXAZOLE-TRIMETHOPRIM 800-160 MG PO TABS: 1.0000 | ORAL_TABLET | ORAL | Status: DC

## 2021-03-02 NOTE — Progress Notes (Signed)
Patient asked to use the phone, RN educated patient as it has added to his agitation previously, MD has been notified and RN will continue to monitor this patient.

## 2021-03-02 NOTE — Plan of Care (Signed)
  Problem: Safety: Goal: Violent Restraint(s) Outcome: Progressing   Problem: Health Behavior/Discharge Planning: Goal: Ability to manage health-related needs will improve Outcome: Progressing   Problem: Clinical Measurements: Goal: Ability to maintain clinical measurements within normal limits will improve Outcome: Progressing Goal: Will remain free from infection Outcome: Progressing   Problem: Coping: Goal: Level of anxiety will decrease Outcome: Progressing   Problem: Elimination: Goal: Will not experience complications related to bowel motility Outcome: Progressing Goal: Will not experience complications related to urinary retention Outcome: Progressing   Problem: Safety: Goal: Ability to remain free from injury will improve Outcome: Progressing   Problem: Skin Integrity: Goal: Risk for impaired skin integrity will decrease Outcome: Progressing

## 2021-03-02 NOTE — Progress Notes (Signed)
Subjective:  HD 7  Patient reports he is doing alright today. He reports feeling tired and is noting concerns about being able to pay his rent. He states that his niece called him and told him that his boss was asking for him and he needs to go pay the rent. He is also asking how much longer he will need to be in the hospital. Reports increased appetite.   Please note patient's name is Jesus Roberson. There is a duplicate chart: MRN [id number].  Objective:  Vital signs in last 24 hours: Vitals:   03/01/21 1009 03/01/21 2102 03/02/21 0632 03/02/21 1032  BP: (!) 137/96 140/81 123/86 112/81  Pulse: 84 87 66 63  Resp: 17 20 20 18   Temp: 98.1 F (36.7 C) 99 F (37.2 C) 98 F (36.7 C) 98.3 F (36.8 C)  TempSrc: Oral  Oral   SpO2: 94% 96% 96% 98%  Weight:      Height:       Physical Exam: General: Laying in bed, no acute distress CV: Regular rate, rhythm. No murmurs, rubs, gallops. Pulm: Clear to auscultation bilaterally. Normal work of breathing. Abdomen: Soft, non-tender, non-distended. Normoactive bowel sounds. Psych: normal mood. Tangential speech.   CBC Latest Ref Rng & Units 03/02/2021 02/28/2021 02/27/2021  WBC 4.0 - 10.5 K/uL 6.6 7.1 4.9  Hemoglobin 13.0 - 17.0 g/dL 10.6(L) 10.3(L) 11.3(L)  Hematocrit 39.0 - 52.0 % 36.9(L) 35.6(L) 38.5(L)  Platelets 150 - 400 K/uL 138(L) 113(L) 125(L)   BMP Latest Ref Rng & Units 03/02/2021 03/01/2021 02/28/2021  Glucose 70 - 99 mg/dL 03/02/2021) 96 79  BUN 6 - 20 mg/dL 875(I) 43(P) 29(J)  Creatinine 0.61 - 1.24 mg/dL 18(A) 4.16(S) 0.63(K)  Sodium 135 - 145 mmol/L 137 140 138  Potassium 3.5 - 5.1 mmol/L 4.5 4.1 4.0  Chloride 98 - 111 mmol/L 102 105 103  CO2 22 - 32 mmol/L 29 30 29   Calcium 8.9 - 10.3 mg/dL 10.6(H) 10.3 10.4(H)   Assessment/Plan: Omeed Osuna is 45yo person with schizophrenia, uncontrolled HIV, CKDIIIb, peripheral neuropathy, hepatitis B, history of seizures admitted 5/21 with suicide attempt and acute kidney injury, now  resolved and medically stable, pending inpatient psychiatry bed.  Principal Problem:   Schizophrenia (HCC) Active Problems:   Acute kidney injury (HCC)   MDD (major depressive disorder), recurrent episode, severe (HCC)   Polysubstance abuse (HCC)   Suicidal intent  #Suicidal intention #Schizophrenia Appreciate psychiatry's assistance with this difficult case. Afternoon dosing of olanzapine added by psychiatry to target afternooon agitation, aggression and behavioral outburts. He continues to exhibit unpredictable behavior requiring [place] [facility]. Unfortunately, there is a shortage of open psychiatric beds proving this to be difficult. - Zyprexa 10mg  BID + afternoon Zyprexa 5mg  - Agitation orders PRN - 1:1 sitter - Pending inpatient psychiatry placement  #Low-grade fevers Noted to have Tmax 100.6 on 5/27 without overt signs of infection on exam. Due to immunocompromised status, cultures drawn which are negative thus far. No leukocytosis on exam. This can also be from Zyprexa which can cause temperature dysregulation. No further fevers noted over past 24 hours. Will continue to monitor for further infectious signs. - Follow-up blood cultures - Daily CBC  #Uncontrolled HIV #Active hepatitis B infection CD4 196. Hepatitis B viral load >1,600,000. Given low G6PD will hold off dapsone. He is also on ARB w/ CKD. Discussed with ID and recommended for Bactrim DS three times weekly, every MWF with close monitoring of renal function.  Will continue with Tivicay and  Descovy, which will cover for both HIV and hepatitis B. - Tivicay, Descovy - Bactrim DS every MWF - Monitor renal function - Appreciate ID assistance  #Hypertension Improved blood pressure control since starting beta blocker although HR borderline low. Will continue current regimen and monitor for any bradycardia  - Losartan 50mg  daily - Amlodipine 5mg  daily - Metoprolol 25mg  daily  - Daily BMP  #Constipation Continuing with  current regimen. Patient has previously stated he does not want enema or suppository. Can consider lactulose if no BM by tomorrow. - Senokot-S 2 tablets BID - Miralax daily - Fibercon 625mg  daily  DIET: HH IVF: n/a DVT PPX: Lovenox BOWEL: Miralax, Senokot-S, Fibercon CODE: FULL FAM COM: n/a  Prior to Admission Living Arrangement: Home Anticipated Discharge Location: inpatient psychiatry Barriers to Discharge: placement Dispo: Anticipated discharge in approximately >3 day(s).   , MD  [facility] PGY-2 03/02/2021, 4:52 PM Pager: [phone] After 5pm on weekdays and 1pm on weekends: On Call pager [phone]

## 2021-03-03 DIAGNOSIS — F250 Schizoaffective disorder, bipolar type: Secondary | ICD-10-CM

## 2021-03-03 LAB — CBC
HCT: 37 % — ABNORMAL LOW (ref 39.0–52.0)
Hemoglobin: 10.5 g/dL — ABNORMAL LOW (ref 13.0–17.0)
MCH: 23.6 pg — ABNORMAL LOW (ref 26.0–34.0)
MCHC: 28.4 g/dL — ABNORMAL LOW (ref 30.0–36.0)
MCV: 83.3 fL (ref 80.0–100.0)
Platelets: 131 10*3/uL — ABNORMAL LOW (ref 150–400)
RBC: 4.44 MIL/uL (ref 4.22–5.81)
RDW: 14.7 % (ref 11.5–15.5)
WBC: 4.9 10*3/uL (ref 4.0–10.5)
nRBC: 0 % (ref 0.0–0.2)

## 2021-03-03 LAB — BASIC METABOLIC PANEL
Anion gap: 5 (ref 5–15)
BUN: 37 mg/dL — ABNORMAL HIGH (ref 6–20)
CO2: 26 mmol/L (ref 22–32)
Calcium: 10.4 mg/dL — ABNORMAL HIGH (ref 8.9–10.3)
Chloride: 105 mmol/L (ref 98–111)
Creatinine, Ser: 3.04 mg/dL — ABNORMAL HIGH (ref 0.61–1.24)
GFR, Estimated: 25 mL/min — ABNORMAL LOW (ref >60)
Glucose, Bld: 125 mg/dL — ABNORMAL HIGH (ref 70–99)
Potassium: 5.1 mmol/L (ref 3.5–5.1)
Sodium: 136 mmol/L (ref 135–145)

## 2021-03-03 NOTE — Consult Note (Signed)
[facility] Face-to-Face Psychiatry Consult   Reason for Consult: ''auditory hallucinations, suicide attempt.''  Referring Physician: Reymundo Poll, MD Patient Identification: Jesus Roberson MRN:  [id number] Principal Diagnosis: Schizophrenia (HCC) Diagnosis:  Principal Problem:   Schizophrenia (HCC) Active Problems:   Acute kidney injury (HCC)   MDD (major depressive disorder), recurrent episode, severe (HCC)   Polysubstance abuse (HCC)   Suicidal intent   Total Time spent with patient: 30 minutes  Subjective: ''I need to go home and pay my rent, you just kept me here doing nothing.'' Objective:  Jesus Roberson is a 45 y.o. male patient admitted with history pf Schizophrenia, Cocaine abuse and MDD who was admitted with psychosis, erratic behavior and suicidal thought with plan. Patient is requesting to be discharged so that he can go pay his rent even though he is still reporting hallucinations, recurrent suicidal thoughts, multiple life stressors( finances, family dysfunction, medical co-morbidities, medical issues-HIV),  loss of family and friends trust. Patient still appears easily agitated and has little to no insight into his mental illness.  Past Psychiatric History: Schizophrenia, depression.  He multiple inpatient hospitalizations, however unable to recall as well as previous inpatient admission.  He states he was admitted to [facility] in prison in 2020.  Previously tried multiple psychiatric however he is unable to recall the medication.   Risk to Self:  Yes Risk to Others:   Yes Prior Inpatient Therapy:   Yes unable to recall Prior Outpatient Therapy:  NO  Past Medical History: History reviewed. No pertinent past medical history.  Family History: History reviewed. No pertinent family history. Family Psychiatric  History: Unknown Social History:  Social History   Substance and Sexual Activity  Alcohol Use Yes     Social History   Substance and Sexual Activity   Drug Use Yes  . Types: Marijuana, "Crack" cocaine    Social History   Socioeconomic History  . Marital status: Married    Spouse name: Not on file  . Number of children: Not on file  . Years of education: Not on file  . Highest education level: Not on file  Occupational History  . Not on file  Tobacco Use  . Smoking status: Former Games developer  . Smokeless tobacco: Never Used  Substance and Sexual Activity  . Alcohol use: Yes  . Drug use: Yes    Types: Marijuana, "Crack" cocaine  . Sexual activity: Not Currently  Other Topics Concern  . Not on file  Social History Narrative  . Not on file   Social Determinants of Health   Financial Resource Strain: Not on file  Food Insecurity: Not on file  Transportation Needs: Not on file  Physical Activity: Not on file  Stress: Not on file  Social Connections: Not on file   Additional Social History:    Allergies:  No Known Allergies  Labs:  Results for orders placed or performed during the hospital encounter of 02/23/21 (from the past 48 hour(s))  CBC     Status: Abnormal   Collection Time: 03/02/21  2:51 AM  Result Value Ref Range   WBC 6.6 4.0 - 10.5 K/uL   RBC 4.44 4.22 - 5.81 MIL/uL   Hemoglobin 10.6 (L) 13.0 - 17.0 g/dL   HCT 03.4 (L) 74.2 - 59.5 %   MCV 83.1 80.0 - 100.0 fL   MCH 23.9 (L) 26.0 - 34.0 pg   MCHC 28.7 (L) 30.0 - 36.0 g/dL   RDW 63.8 75.6 - 43.3 %  Platelets 138 (L) 150 - 400 K/uL    Comment: CONSISTENT WITH PREVIOUS RESULT REPEATED TO VERIFY    nRBC 0.0 0.0 - 0.2 %    Comment: Performed at [facility], [address] [place], [place] [address]  Basic metabolic panel     Status: Abnormal   Collection Time: 03/02/21  2:51 AM  Result Value Ref Range   Sodium 137 135 - 145 mmol/L   Potassium 4.5 3.5 - 5.1 mmol/L   Chloride 102 98 - 111 mmol/L   CO2 29 22 - 32 mmol/L   Glucose, Bld 102 (H) 70 - 99 mg/dL    Comment: Glucose reference range applies only to samples taken after fasting for at  least 8 hours.   BUN 28 (H) 6 - 20 mg/dL   Creatinine, Ser 1.912.58 (H) 0.61 - 1.24 mg/dL   Calcium 47.810.6 (H) 8.9 - 10.3 mg/dL   GFR, Estimated 30 (L) >60 mL/min    Comment: (NOTE) Calculated using the CKD-EPI Creatinine Equation (2021)    Anion gap 6 5 - 15    Comment: Performed at [facility], [address] [place], [place] [address]  CBC     Status: Abnormal   Collection Time: 03/03/21  7:38 AM  Result Value Ref Range   WBC 4.9 4.0 - 10.5 K/uL   RBC 4.44 4.22 - 5.81 MIL/uL   Hemoglobin 10.5 (L) 13.0 - 17.0 g/dL   HCT 13.037.0 (L) 86.539.0 - 78.452.0 %   MCV 83.3 80.0 - 100.0 fL   MCH 23.6 (L) 26.0 - 34.0 pg   MCHC 28.4 (L) 30.0 - 36.0 g/dL   RDW 69.614.7 29.511.5 - 28.415.5 %   Platelets 131 (L) 150 - 400 K/uL    Comment: REPEATED TO VERIFY   nRBC 0.0 0.0 - 0.2 %    Comment: Performed at [facility], [address] [place], [place] [address]  Basic metabolic panel     Status: Abnormal   Collection Time: 03/03/21  7:38 AM  Result Value Ref Range   Sodium 136 135 - 145 mmol/L   Potassium 5.1 3.5 - 5.1 mmol/L   Chloride 105 98 - 111 mmol/L   CO2 26 22 - 32 mmol/L   Glucose, Bld 125 (H) 70 - 99 mg/dL    Comment: Glucose reference range applies only to samples taken after fasting for at least 8 hours.   BUN 37 (H) 6 - 20 mg/dL   Creatinine, Ser 0.103.04 (H) 0.61 - 1.24 mg/dL   Calcium 27.210.4 (H) 8.9 - 10.3 mg/dL   GFR, Estimated 25 (L) >60 mL/min    Comment: (NOTE) Calculated using the CKD-EPI Creatinine Equation (2021)    Anion gap 5 5 - 15    Comment: Performed at [facility], [address] [place], [place] [address]    Current Facility-Administered Medications  Medication Dose Route Frequency Provider Last Rate Last Admin  . acetaminophen (TYLENOL) tablet 650 mg  650 mg Oral Q6H PRN Eliezer BottomAslam, Sadia, MD   650 mg at 02/28/21 2016   Or  . acetaminophen (TYLENOL) suppository 650 mg  650 mg Rectal Q6H PRN Aslam, Sadia, MD      . acetaminophen (TYLENOL) tablet 650 mg  650 mg Oral  Once Terald Sleeperrifan, Matthew J, MD      . amLODipine (NORVASC) tablet 5 mg  5 mg Oral Daily Evlyn KannerBraswell, Phillip, MD   5 mg at 03/03/21 [id number]  . dolutegravir (TIVICAY)  tablet 50 mg  50 mg Oral Daily Daiva Eves, Lisette Grinder, MD   50 mg at 03/03/21 0920  . emtricitabine-tenofovir AF (DESCOVY) 200-25 MG per tablet 1 tablet  1 tablet Oral Daily Daiva Eves, Lisette Grinder, MD   1 tablet at 03/03/21 0920  . enoxaparin (LOVENOX) injection 40 mg  40 mg Subcutaneous Q24H Norva Pavlov, RPH   40 mg at 03/02/21 1256  . ibuprofen (ADVIL) tablet 600 mg  600 mg Oral Once Terald Sleeper, MD      . LORazepam (ATIVAN) injection 1-2 mg  1-2 mg Intravenous Q4H PRN Evlyn Kanner, MD   2 mg at 03/01/21 1643  . losartan (COZAAR) tablet 50 mg  50 mg Oral Daily Evlyn Kanner, MD   50 mg at 03/03/21 0920  . metoprolol succinate (TOPROL-XL) 24 hr tablet 25 mg  25 mg Oral Daily Evlyn Kanner, MD   25 mg at 03/03/21 0920  . OLANZapine zydis (ZYPREXA) disintegrating tablet 10 mg  10 mg Oral BID Maryagnes Amos, FNP   10 mg at 03/03/21 0920  . OLANZapine zydis (ZYPREXA) disintegrating tablet 5 mg  5 mg Oral Q8H PRN Aslam, Sadia, MD   5 mg at 03/01/21 1643   And  . ziprasidone (GEODON) injection 20 mg  20 mg Intramuscular PRN Aslam, Leanna Sato, MD      . polycarbophil (FIBERCON) tablet 625 mg  625 mg Oral Daily Evlyn Kanner, MD   625 mg at 03/03/21 0920  . polyethylene glycol (MIRALAX / GLYCOLAX) packet 17 g  17 g Oral Daily Evlyn Kanner, MD   17 g at 03/03/21 0921  . pregabalin (LYRICA) capsule 75 mg  75 mg Oral TID Eliezer Bottom, MD   75 mg at 03/03/21 0920  . senna-docusate (Senokot-S) tablet 2 tablet  2 tablet Oral BID Evlyn Kanner, MD   2 tablet at 03/03/21 0920  . sodium chloride flush (NS) 0.9 % injection 3 mL  3 mL Intravenous Q12H Eliezer Bottom, MD   3 mL at 03/03/21 0921  . [START ON 03/04/2021] sulfamethoxazole-trimethoprim (BACTRIM DS) 800-160 MG per tablet 1 tablet  1 tablet Oral Once per day on Mon  Wed Fri Eliezer Bottom, MD        Musculoskeletal: Strength & Muscle Tone: within normal limits Gait & Station: normal Patient leans: N/A   Psychiatric Specialty Exam:  Presentation  General Appearance: Disheveled  Eye Contact:Fair  Speech:Clear and Coherent; Normal Rate  Speech Volume:Normal  Handedness:Right   Mood and Affect  Mood:Angry; Irritable; Labile  Affect:Blunt; Full Range; Labile   Thought Process  Thought Processes:Irrevelant  Descriptions of Associations:Tangential  Orientation:Full (Time, Place and Person)  Thought Content:Perseveration; Rumination; Tangential; Paranoid Ideation  History of Schizophrenia/Schizoaffective disorder:Yes  Duration of Psychotic Symptoms:Greater than six months  Hallucinations:No data recorded  Ideas of Reference:Paranoia  Suicidal Thoughts:No data recorded  Homicidal Thoughts:No data recorded   Sensorium  Memory:Immediate Fair; Recent Poor; Remote Fair  Judgment:Impaired  Insight:Shallow   Executive Functions  Concentration:Poor  Attention Span:Poor  Recall:Poor  Fund of Knowledge:Fair  Language:Fair   Psychomotor Activity  Psychomotor Activity:No data recorded   Assets  Assets:Physical Health; Resilience   Sleep  Sleep:No data recorded   Physical Exam: Physical Exam Constitutional:      Appearance: Normal appearance. He is normal weight.  Neurological:     Mental Status: He is alert.  Psychiatric:        Attention and Perception: He perceives auditory hallucinations.  Mood and Affect: Mood normal. Affect is labile and angry.        Speech: Speech is tangential.        Behavior: Behavior is uncooperative and agitated.        Thought Content: Thought content is paranoid.        Judgment: Judgment is impulsive.    Review of Systems  Psychiatric/Behavioral: Positive for depression, hallucinations, substance abuse and suicidal ideas. Negative for memory loss. The patient is  nervous/anxious and has insomnia.   All other systems reviewed and are negative.  Blood pressure 113/66, pulse 63, temperature 97.7 F (36.5 C), temperature source Oral, resp. rate 19, height 5\' 7"  (1.702 m), weight 84.1 kg, SpO2 96 %. Body mass index is 29.04 kg/m.  Treatment Plan Summary: Recommendations: -Continue 1:1 sitter for safety -Continue  Olanzapine zydis 10mg  po bid for psychosis/delusions -Continue Olanzapine Zydis 5 mg po q8h PRN by mouth for agitation.  -Will recommend TOC consult for referral to inpatient psychiatric unit once medically stable.   Disposition: Recommend psychiatric Inpatient admission when medically cleared.  , MD 03/03/2021 11:44 AM

## 2021-03-03 NOTE — Progress Notes (Signed)
Subjective:  HD8  This morning patient says he's doing okay. Mentions that he is tired but otherwise no complaints at this time. He does report a bowel movement yesterday.  Please note patient's name is Jesus Roberson. There is a duplicate chart: MRN [id number].  Objective:  Vital signs in last 24 hours: Vitals:   03/02/21 1032 03/02/21 1831 03/02/21 2151 03/03/21 0431  BP: 112/81 114/79 119/67 114/82  Pulse: 63 67 77 61  Resp: 18 16 18 20   Temp: 98.3 F (36.8 C) 98.2 F (36.8 C) 98.4 F (36.9 C) 98.4 F (36.9 C)  TempSrc:  Oral Oral Oral  SpO2: 98% 96% 97% 100%  Weight:      Height:       Physical Exam: General: Resting comfortably in bed, no acute distress CV: Regular rate, rhythm. No murmurs, rubs, gallops. Warm extremities Pulm: Normal work of breathing on room air Abdomen: Soft, non-tender, non-distended. MSK: No pitting edema bilaterally. Psych: Flat affect. Tangential speech.  CBC Latest Ref Rng & Units 03/03/2021 03/02/2021 02/28/2021  WBC 4.0 - 10.5 K/uL 4.9 6.6 7.1  Hemoglobin 13.0 - 17.0 g/dL 10.5(L) 10.6(L) 10.3(L)  Hematocrit 39.0 - 52.0 % 37.0(L) 36.9(L) 35.6(L)  Platelets 150 - 400 K/uL 131(L) 138(L) 113(L)   BMP Latest Ref Rng & Units 03/03/2021 03/02/2021 03/01/2021  Glucose 70 - 99 mg/dL 03/03/2021) 902(I) 96  BUN 6 - 20 mg/dL 097(D) 53(G) 99(M)  Creatinine 0.61 - 1.24 mg/dL 42(A) 8.34(H) 9.62(I)  Sodium 135 - 145 mmol/L 136 137 140  Potassium 3.5 - 5.1 mmol/L 5.1 4.5 4.1  Chloride 98 - 111 mmol/L 105 102 105  CO2 22 - 32 mmol/L 26 29 30   Calcium 8.9 - 10.3 mg/dL 10.4(H) 10.6(H) 10.3   Assessment/Plan: Jesus Roberson is 45yo person with schizophrenia, uncontrolled HIV, CKDIIIb, peripheral neuropathy, hepatitis B, history of seizures admitted 5/21 with suicide attempt and acute kidney injury, continually stable, pending inpatient psychiatry bed.  Principal Problem:   Schizophrenia (HCC) Active Problems:   Acute kidney injury (HCC)   MDD (major depressive  disorder), recurrent episode, severe (HCC)   Polysubstance abuse (HCC)   Suicidal intent  #Suicidal ideation #Schizophrenia Appears recently added-on afternoon dosing of Zyprexa has helped decrease amount of agitation and aggression. However, he continually exhibits signs of instability and unpredictability requiring a sitter. Patient would be best served in an inpatient psychiatric bed, however a shortage of open beds has made this difficult.  - Zyprexa 10mg  BID + afternoon Zyprexa 5mg  - Agitation orders PRN - 1:1 sitter - Pending placement for inpatient psychiatry  #Low-grade fevers Blood cultures without growth thus far. No signs of fevers since 5/27. Given immunocompromised status will monitor for any further signs of infection. - Follow-up blood cultures - Daily CBC  #Uncontrolled HIV #Active hepatitis B infection CD4 196, hepatitis B viral load >1,600,000. Per infectious disease, will continue with Bactrim DS three times weekly, every MWF. Patient had low G6PD therefore no dapsone. Unfortunately appears renal function has gradually worsened over the last 24-48 hours since Bactrim was started. Can consider discontinuing given he was borderline and has re-started anti-retroviral therapy. - Tivicay, Descovy - Bactrim DS every MWF - Daily BMP - Appreciate ID assistance  #Acute kidney injury on chronic kidney disease sCr 3.04 from baseline of 2.4. Patient has been eating all of his meals appropriately, having good po intake. Likely renal function affected by Bactrim used for PJP prophylaxis. Can consider stopping Bactrim if continues to have worsening renal  function. - Daily BMP - Strict I/O  #Hypertension Blood pressures have improved since titrating antihypertensives. Will continue with current regimen and monitor electrolytes and kidney function. - Losartan 50mg  daily - Amlodipine 5mg  daily - Metoprolol 25mg  daily - Daily BMP  #Constipation Patient did previously have bowel  movement yesterday. He reports he does not want enema or suppository. Can add lactulose if issues persist. - Senokot-S 2 tablets BID - Miralax daily - Fibercon 625mg  daily  DIET: HH IVF: n/a DVT PPX: Lovenox BOWEL: Miralax, Senokot-S, Fibercon CODE: FULL FAM COM: n/a  Prior to Admission Living Arrangement: Home Anticipated Discharge Location: inpatient psychiatry Barriers to Discharge: placement Dispo: Anticipated discharge in approximately >2 day(s).   , MD 03/03/2021, 7:36 AM Pager: [phone] After 5pm on weekdays and 1pm on weekends: On Call pager [phone]

## 2021-03-04 LAB — CBC
HCT: 33.2 % — ABNORMAL LOW (ref 39.0–52.0)
Hemoglobin: 9.7 g/dL — ABNORMAL LOW (ref 13.0–17.0)
MCH: 24 pg — ABNORMAL LOW (ref 26.0–34.0)
MCHC: 29.2 g/dL — ABNORMAL LOW (ref 30.0–36.0)
MCV: 82.2 fL (ref 80.0–100.0)
Platelets: 128 10*3/uL — ABNORMAL LOW (ref 150–400)
RBC: 4.04 MIL/uL — ABNORMAL LOW (ref 4.22–5.81)
RDW: 14.7 % (ref 11.5–15.5)
WBC: 5.3 10*3/uL (ref 4.0–10.5)
nRBC: 0 % (ref 0.0–0.2)

## 2021-03-04 LAB — BASIC METABOLIC PANEL
Anion gap: 6 (ref 5–15)
BUN: 41 mg/dL — ABNORMAL HIGH (ref 6–20)
CO2: 27 mmol/L (ref 22–32)
Calcium: 10.4 mg/dL — ABNORMAL HIGH (ref 8.9–10.3)
Chloride: 103 mmol/L (ref 98–111)
Creatinine, Ser: 3.21 mg/dL — ABNORMAL HIGH (ref 0.61–1.24)
GFR, Estimated: 23 mL/min — ABNORMAL LOW (ref >60)
Glucose, Bld: 80 mg/dL (ref 70–99)
Potassium: 4.8 mmol/L (ref 3.5–5.1)
Sodium: 136 mmol/L (ref 135–145)

## 2021-03-04 MED ORDER — OLANZAPINE 5 MG PO TBDP: 5.0000 mg | ORAL_TABLET | Freq: Every day | ORAL | Status: DC

## 2021-03-04 MED ORDER — OLANZAPINE 5 MG PO TBDP
5.0000 mg | ORAL_TABLET | Freq: Every day | ORAL | Status: DC
  Administered 2021-03-04: 5 mg via ORAL
  Filled 2021-03-04 (×2): qty 1

## 2021-03-04 MED ORDER — LACTATED RINGERS IV BOLUS
1000.0000 mL | Freq: Once | INTRAVENOUS | Status: AC
  Administered 2021-03-04: 1000 mL via INTRAVENOUS

## 2021-03-04 MED ORDER — FLUTICASONE PROPIONATE 50 MCG/ACT NA SUSP
2.0000 | Freq: Every day | NASAL | Status: DC
  Administered 2021-03-04 – 2021-03-05 (×2): 2 via NASAL
  Filled 2021-03-04: qty 16

## 2021-03-04 NOTE — Discharge Summary (Signed)
Name: Jesus Roberson MRN: [id number] DOB: [birth year] 44 y.o. PCP: Pcp, No  Date of Admission: 02/23/2021  4:59 AM Date of Discharge: 03/05/2021 Attending Physician: Earl Lagos, MD  Discharge Diagnosis: 1. Suicidal ideation 2/2 active schizophrenia 2. Acute on chronic renal insufficiency 3. Uncontrolled HIV 4. Active hepatitis B infection 5. Hypertension 6. Chronic peripheral neuropathy 7. Subclinical hyperthyroidism  Discharge Medications: Allergies as of 03/05/2021   No Known Allergies     Medication List    STOP taking these medications   multivitamin with minerals Tabs tablet   polyvinyl alcohol 1.4 % ophthalmic solution Commonly known as: LIQUIFILM TEARS     TAKE these medications   amLODipine 5 MG tablet Commonly known as: NORVASC Take 1 tablet (5 mg total) by mouth daily. Start taking on: March 06, 2021   [facility] HEADACHE POWDER PO Take 1 packet by mouth 2 (two) times daily as needed (headache/pain).   dolutegravir 50 MG tablet Commonly known as: TIVICAY Take 1 tablet (50 mg total) by mouth daily. Start taking on: March 06, 2021   emtricitabine-tenofovir AF 200-25 MG tablet Commonly known as: DESCOVY Take 1 tablet by mouth daily. Start taking on: March 06, 2021   fluticasone 50 MCG/ACT nasal spray Commonly known as: FLONASE Place 2 sprays into both nostrils daily. Start taking on: March 06, 2021   losartan 50 MG tablet Commonly known as: COZAAR Take 1 tablet (50 mg total) by mouth daily. Start taking on: March 06, 2021   metoprolol succinate 25 MG 24 hr tablet Commonly known as: TOPROL-XL Take 1 tablet (25 mg total) by mouth daily. Start taking on: March 06, 2021   OLANZapine 5 MG tablet Commonly known as: ZYPREXA Take 2 tablets (10 mg total) by mouth 2 (two) times daily. May also take 1 tablet (5 mg total) daily as needed.   pregabalin 75 MG capsule Commonly known as: LYRICA Take 1 capsule (75 mg total) by mouth 3 (three) times daily.       Disposition and follow-up:   Jesus Roberson was discharged from [facility] in Stable condition.  At the hospital follow up visit please address:  1. Suicidal ideation 2/2 active schizophrenia: Patient to follow-up with [facility].  2. Acute on chronic renal insufficiency: Avoid Bactrim for PJP prophylaxis. Baseline sCr 2.3 w/ GFR 30-35.  3. Uncontrolled HIV/Hep B: On Descovy and Tivicay. Encourage compliance and follow-up with infectious disease. Limited options for PJP prophylaxis, will hold for now.  4. Hypertension: Multiple new medications, BP was well-controlled on this regimen while inpatient. Will need to establish with primary care.  6. Chronic peripheral neuropathy: Tolerating Lyrica well, can up-titrate as needed.  7. Subclinical hyperthyroidism: Will need follow-up thyroid studies in 6-8 weeks.  8.  Labs / imaging needed at time of follow-up: BMP  9.  Pending labs/ test needing follow-up: n/a  Follow-up Appointments:    Follow-up Information    [facility]             . Schedule an appointment as soon as possible for a visit in 1 month(s).   Contact information: [address] [place] [address]       [facility] [employer]. Call.   Why: go to 2nd floor walk ins accepted Monday-Thursday 8am-11am; go to second floor. Please arrive at 7:30am to be seen on the day you come. Contact information: [address] [phone]  [facility]. Schedule an appointment as soon as possible for a visit in 1 week(s).   Contact information: [address] [place] [address] [phone]              Hospital Course by problem list: 1. Suicidal ideation 2/2 active schizophrenia: Mr. Lelon Ikard arrived on Feb 23, 2021 via [facility] after suicide attempt. Mr. Sobol had been hearing  voices and doing recreational drugs in order to drown them out prior to this episode. Unfortunately, patient had to be tear gassed by the police in order to be restrained. On arrival to the ED, patient was taken immediately to decontamination room and washed with water and detergent to remove remaining tear gas. At that time he was involuntarily committed and required physical and chemical restraints. IMTS was subsequently called for admission. Psychiatry was soon consulted and evaluated patient the next morning. It was determined patient was showing active signs of schizophrenia and was started on Zyprexa twice daily. Over the next couple of days, patient had increasing agitation in the afternoon requiring security to be called multiple times to help de-escalate the situation. An additional dose of Zyprexa was subsequently added in the afternoon, which helped calm the patient throughout the day. Since May 22, patient has been medically cleared for discharge. Unfortunately, there is a shortage of inpatient psychiatry beds which has made this difficult. On 5/31 it was determined by psychiatry that patient was stable for discharge and did not meet criteria for inpatient psychiatry. He was subsequently discharged with plans to follow-up with [facility].  2. Acute on chronic renal insufficiency: On arrival to [facility], patient was found to have sCr 3.14 w/ GFR 24. Unfortunately we were unable to determine patient's past medical history on admission given his chemical restraints. He was started on fluids overnight. The next morning, patient was alert and talking to IMTS team on rounds and reported a history of CKD. Patient's renal function normalized out with apparent baseline of sCr ~2.3 w/ GFR 30-35. He remained at this baseline until the next weekend after he was started on Bactrim for PJP prophylaxis. Bactrim was subsequently stopped and his renal function remained at baseline throughout rest of  hospitalization.  3. Uncontrolled HIV: Mr. Kardell reported history of HIV and has not been on anti-retroviral regimen in at least eight months. Lab work during this admission revealed CD4+ 196 w/ viral load >72k. ID was consulted to initiate anti-retroviral medications. Descovy and Tivicay were started and were tolerated well by the patient. There was a concern for PJP prophylaxis w/ CD4 <200, however Bactrim cause acute kidney injury and G6PD was low, preventing him from starting dapsone. However, given he is borderline for needing prophylaxis, hopeful with continual anti-retroviral therapy that he will not need any moving forward. He is to follow-up with infectious disease once stable and discharged.  4. Active hepatitis B infection: Mr. Kendra also reported a history of hepatitis B. Further lab work revealed active hepatitis B with viral load >1,600,000. ID was consulted for further recommendations. He was started on Tivicay and Descovy to cover for both HIV and Hep B. He will follow-up with ID in outpatient setting.  5. Hypertension: Patient's blood pressure was elevated throughout the first couple days of admission. He had not been on any antihypertensives for at least 8 months. Patient eventually required triple therapy for his BP to be at goal, including ARB, beta blocker, and CCB. Thankfully patient's blood  pressure remained at goal and will be discharged with these medications.  6. Chronic peripheral neuropathy: Mr. Laurent reports chronic neuropathy in bilateral lower extremities. He has previously tried gabapentin in the past, however this caused unwanted side effects. He was willing to try Lyrica for further management. Mr. Lorge later reported that Lyrica was controlling his pain well.  7. Subclinical hyperthyroidism: During initial work-up, TSH found to be low w/ normal T4. No symptoms of hyperthyroidism currently, will need follow-up thyroid studies in 6-8 weeks.  Discharge Exam:   BP 113/76  (BP Location: Left Arm)   Pulse 69   Temp 99 F (37.2 C) (Oral)   Resp 18   Ht 5\' 7"  (1.702 m)   Wt 84.1 kg   SpO2 96%   BMI 29.04 kg/m  General: Laying in bed in no acute distress CV: Regular rate, rhythm. No m/r/g Pulm: Normal work of breathing on room air Abdomen: Soft, non-tender, non-distended. MSK: No pitting edema bilaterally. Psych: Pleasant, normal mood, affect.  Pertinent Labs, Studies, and Procedures:  CBC Latest Ref Rng & Units 03/04/2021 03/03/2021 03/02/2021  WBC 4.0 - 10.5 K/uL 5.3 4.9 6.6  Hemoglobin 13.0 - 17.0 g/dL 03/04/2021) 10.5(L) 10.6(L)  Hematocrit 39.0 - 52.0 % 33.2(L) 37.0(L) 36.9(L)  Platelets 150 - 400 K/uL 128(L) 131(L) 138(L)   BMP Latest Ref Rng & Units 03/05/2021 03/04/2021 03/03/2021  Glucose 70 - 99 mg/dL 87 80 03/05/2021)  BUN 6 - 20 mg/dL 638(V) 56(E) 33(I)  Creatinine 0.61 - 1.24 mg/dL 95(J) 8.84(Z) 6.60(Y)  Sodium 135 - 145 mmol/L 138 136 136  Potassium 3.5 - 5.1 mmol/L 5.4(H) 4.8 5.1  Chloride 98 - 111 mmol/L 104 103 105  CO2 22 - 32 mmol/L 27 27 26   Calcium 8.9 - 10.3 mg/dL 10.5(H) 10.4(H) 10.4(H)   CD4+ 196 w/ viral load 72,000  HepB viral load >1,600,000  Discharge Instructions:  Mr. Njoku, I am so glad you are feeling better and can be discharged from the hospital. We have started you on some medications, so please see the following notes:  -Please make sure that you are taking the following medication as prescribed daily for schizophrenia: Olanzapine (Zyprexa) 10mg  (2 tablets) in the morning and at night. Olanzapine (Zyprexa) 5mg  (1 tablet) in the afternoon.  -For your blood pressure: Amlodipine 5mg  daily Metoprolol 25mg  daily Losartan 50mg  daily  -For your HIV and hepatitis B: Descovy and Tivicay each once daily  -For your neuropathy: Pregabalin (LYRICA) 75mg  three times per day  Please make sure to follow-up with psychiatry and and Wellness.  It was a pleasure meeting you, Mr. Ruffins. I wish you the best and hope  you stay healthy!  Thank you, , MD  Signed: , MD 03/05/2021, 2:01 PM   Pager: [phone]

## 2021-03-04 NOTE — Progress Notes (Signed)
Subjective:  HD9  This morning patient states that he is having som congestion, although this appears to be a chronic problem. He has had bowel movements without any issues.   Please note patient's name is Jesus Roberson. There is a duplicate chart: MRN [id number].  Objective:  Vital signs in last 24 hours: Vitals:   03/03/21 1018 03/03/21 1759 03/03/21 2040 03/04/21 0546  BP: 113/66 113/77 107/71 108/66  Pulse: 63 68 71 61  Resp: 19 17 20 20   Temp: 97.7 F (36.5 C) 98.5 F (36.9 C) 98.4 F (36.9 C) 98 F (36.7 C)  TempSrc: Oral Oral Oral Oral  SpO2: 96% 97% 96% 100%  Weight:      Height:       Physical Exam: General: Laying in bed comfortably in no acute distress CV: Regular rate, rhythm. No murmurs, rubs, gallops appreciated. Pulm: Normal work of breathing on room air without use accessory muscles Abdomen: Soft, non-tender, non-distended. Normoactive bowel sounds. Psych: Flat affect. Tangential and disorganized speech.  CBC Latest Ref Rng & Units 03/04/2021 03/03/2021 03/02/2021  WBC 4.0 - 10.5 K/uL 5.3 4.9 6.6  Hemoglobin 13.0 - 17.0 g/dL 03/04/2021) 10.5(L) 10.6(L)  Hematocrit 39.0 - 52.0 % 33.2(L) 37.0(L) 36.9(L)  Platelets 150 - 400 K/uL 128(L) 131(L) 138(L)   BMP Latest Ref Rng & Units 03/04/2021 03/03/2021 03/02/2021  Glucose 70 - 99 mg/dL 80 03/04/2021) 093(G)  BUN 6 - 20 mg/dL 182(X) 93(Z) 16(R)  Creatinine 0.61 - 1.24 mg/dL 67(E) 9.38(B) 0.17(P)  Sodium 135 - 145 mmol/L 136 136 137  Potassium 3.5 - 5.1 mmol/L 4.8 5.1 4.5  Chloride 98 - 111 mmol/L 103 105 102  CO2 22 - 32 mmol/L 27 26 29   Calcium 8.9 - 10.3 mg/dL 10.4(H) 10.4(H) 10.6(H)   Assessment/Plan: Jesus Roberson is 44yo person with schizophrenia, uncontrolled HIV, CKDIIIb, peripheral neuropathy, hepatitis B, history of seizures admitted 5/21 with suicide attempt and acute kidney injury. Jesus Roberson has been medically stable and cleared for discharge to inpatient psychiatry bed since 5/22.  Principal Problem:    Schizophrenia (HCC) Active Problems:   Acute kidney injury (HCC)   MDD (major depressive disorder), recurrent episode, severe (HCC)   Polysubstance abuse (HCC)   Suicidal intent  #Suicidal ideation #Schizophrenia Patient has remained less aggressive over the last few days with the additional Zyprexa available in the afternoon. Despite this, patient requires inpatient psychiatry to obtain better control of his symptoms. Unfortunately, there is a shortage of open beds. Jesus Roberson is medically cleared for discharge when a bed becomes available. Appreciate psychiatry's assistance. - Zyprexa 10mg  BID + afternoon Zyprexa 5mg  - Agitation orders - 1:1 sitter - Medically stable, pending placement  #Acute kidney injury on chronic kidney disease Patient has had good po intake since arrival. Over the last two days his renal function has worsened. Suspect this is possibly due to starting Bactrim for PJP prophylaxis. Will hold off, give 1L fluids. Despite this, patient remains medically cleared for discharge when a bed becomes available. - 1L LR bolus - D/c Bactrim - Daily BMP - Strict I/O - Avoid nephrotoxins  #Uncontrolled HIV #Active hepatitis B CD4 196, hepatitis B viral load >1,600,000. Will hold off Bactrim for now given worsening renal function and hold dapsone given low G6PD. Patient is already borderline for needing prophylaxis, hopeful with appropriate retro-antiviral therapies he will no longer require this. - Tivicay, Descovy - D/c Bactrim - Daily BMP - Appreciate ID assistance  #Hypertension Blood pressures have remained  stable and normotensive since starting current regimen. - Losartan 50mg  daily - Amlodipine 5mg  daily - Metoprolol 25mg  daily - Daily BMP  #Congestion Patient has chronic congestion, he reports 2/2 surgical plate that was inserted in maxilla. States this has been previously controlled with Flonase. - Fluticasone 2 puffs daily  DIET: HH IVF: n/a DVT PPX:  Lovenox BOWEL: Miralax, Senokot-S, Fibercon CODE: FULL FAM COM: n/a   Prior to Admission Living Arrangement: Unhomed Anticipated Discharge Location: Inpatient psychiatry Barriers to Discharge: placement Dispo: Medically stable. Pending inpatient psychiatry placement.  , MD 03/04/2021, 6:22 AM Pager: [phone] After 5pm on weekdays and 1pm on weekends: On Call pager [phone]
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
## 2021-03-05 ENCOUNTER — Other Ambulatory Visit (HOSPITAL_COMMUNITY): Payer: Self-pay

## 2021-03-05 DIAGNOSIS — E875 Hyperkalemia: Secondary | ICD-10-CM

## 2021-03-05 LAB — BASIC METABOLIC PANEL
Anion gap: 7 (ref 5–15)
BUN: 41 mg/dL — ABNORMAL HIGH (ref 6–20)
CO2: 27 mmol/L (ref 22–32)
Calcium: 10.5 mg/dL — ABNORMAL HIGH (ref 8.9–10.3)
Chloride: 104 mmol/L (ref 98–111)
Creatinine, Ser: 3.07 mg/dL — ABNORMAL HIGH (ref 0.61–1.24)
GFR, Estimated: 25 mL/min — ABNORMAL LOW (ref >60)
Glucose, Bld: 87 mg/dL (ref 70–99)
Potassium: 5.4 mmol/L — ABNORMAL HIGH (ref 3.5–5.1)
Sodium: 138 mmol/L (ref 135–145)

## 2021-03-05 MED ORDER — AMLODIPINE BESYLATE 5 MG PO TABS
5.0000 mg | ORAL_TABLET | Freq: Every day | ORAL | 0 refills | Status: AC
  Filled 2021-03-05: qty 30, 30d supply, fill #0

## 2021-03-05 MED ORDER — OLANZAPINE 5 MG PO TABS: 10.0000 mg | ORAL_TABLET | Freq: Two times a day (BID) | ORAL | Status: DC

## 2021-03-05 MED ORDER — FLUTICASONE PROPIONATE 50 MCG/ACT NA SUSP
2.0000 | Freq: Every day | NASAL | 0 refills | Status: AC
  Filled 2021-03-05: qty 16, 30d supply, fill #0

## 2021-03-05 MED ORDER — PREGABALIN 75 MG PO CAPS
75.0000 mg | ORAL_CAPSULE | Freq: Three times a day (TID) | ORAL | 0 refills | Status: AC
  Filled 2021-03-05: qty 90, 30d supply, fill #0

## 2021-03-05 MED ORDER — OLANZAPINE 5 MG PO TABS
ORAL_TABLET | ORAL | 0 refills | Status: AC
  Filled 2021-03-05: qty 60, 12d supply, fill #0

## 2021-03-05 MED ORDER — OLANZAPINE 5 MG PO TABS: 5.0000 mg | ORAL_TABLET | Freq: Every day | ORAL | Status: DC

## 2021-03-05 MED ORDER — METOPROLOL SUCCINATE ER 25 MG PO TB24
25.0000 mg | ORAL_TABLET | Freq: Every day | ORAL | 0 refills | Status: AC
  Filled 2021-03-05: qty 30, 30d supply, fill #0

## 2021-03-05 MED ORDER — SODIUM ZIRCONIUM CYCLOSILICATE 5 G PO PACK
5.0000 g | PACK | Freq: Every day | ORAL | Status: DC
  Administered 2021-03-05: 5 g via ORAL
  Filled 2021-03-05: qty 1

## 2021-03-05 MED ORDER — LOSARTAN POTASSIUM 50 MG PO TABS
50.0000 mg | ORAL_TABLET | Freq: Every day | ORAL | 0 refills | Status: AC
  Filled 2021-03-05: qty 30, 30d supply, fill #0

## 2021-03-05 MED ORDER — DOLUTEGRAVIR SODIUM 50 MG PO TABS
50.0000 mg | ORAL_TABLET | Freq: Every day | ORAL | 0 refills | Status: AC
  Filled 2021-03-05: qty 30, 30d supply, fill #0

## 2021-03-05 MED ORDER — EMTRICITABINE-TENOFOVIR AF 200-25 MG PO TABS
1.0000 | ORAL_TABLET | Freq: Every day | ORAL | 0 refills | Status: AC
  Filled 2021-03-05: qty 30, 30d supply, fill #0

## 2021-03-05 NOTE — TOC Transition Note (Signed)
Transition of Care [facility]) - CM/SW Discharge Note   Patient Details  Name: Jesus Roberson MRN: [id number] Date of Birth: [birth year]  Transition of Care [facility]) CM/SW Contact:  Lawerance Sabal, RN Phone Number: 03/05/2021, 4:06 PM   Clinical Narrative:    Patient provided with discharge scripts through [facility] pharmacy with [facility] and copay covered through petty cash. CMA to schedule follow up appointment and notify patient of date and time    Final next level of care: Home/Self Care Barriers to Discharge: No Barriers Identified   Patient Goals and CMS Choice        Discharge Placement                       Discharge Plan and Services                                     Social Determinants of Health (SDOH) Interventions     Readmission Risk Interventions No flowsheet data found.

## 2021-03-05 NOTE — Progress Notes (Signed)
Subjective:  HD10  This morning patient evaluated at bedside. States he's doing well, no current complaints. He does request a list of foods he can eat upon discharge.  Please note patient's name is Jesus Roberson. There is a duplicate chart: MRN [id number].  Objective:  Vital signs in last 24 hours: Vitals:   03/04/21 1720 03/04/21 1934 03/05/21 0500 03/05/21 0525  BP: 113/76 138/86  109/71  Pulse: 69 78  60  Resp: 18 18  18   Temp: 99 F (37.2 C) 98.8 F (37.1 C)  97.8 F (36.6 C)  TempSrc: Oral Oral  Oral  SpO2: 96% 98%  97%  Weight:   87.5 kg   Height:       Physical Exam: General: Laying in bed in no acute distress CV: Regular rate, rhythm. No m/r/g Pulm: Normal work of breathing on room air Abdomen: Soft, non-tender, non-distended. MSK: No pitting edema bilaterally.  CBC Latest Ref Rng & Units 03/04/2021 03/03/2021 03/02/2021  WBC 4.0 - 10.5 K/uL 5.3 4.9 6.6  Hemoglobin 13.0 - 17.0 g/dL 03/04/2021) 10.5(L) 10.6(L)  Hematocrit 39.0 - 52.0 % 33.2(L) 37.0(L) 36.9(L)  Platelets 150 - 400 K/uL 128(L) 131(L) 138(L)   BMP Latest Ref Rng & Units 03/05/2021 03/04/2021 03/03/2021  Glucose 70 - 99 mg/dL 87 80 03/05/2021)  BUN 6 - 20 mg/dL 956(L) 87(F) 64(P)  Creatinine 0.61 - 1.24 mg/dL 32(R) 5.18(A) 4.16(S)  Sodium 135 - 145 mmol/L 138 136 136  Potassium 3.5 - 5.1 mmol/L 5.4(H) 4.8 5.1  Chloride 98 - 111 mmol/L 104 103 105  CO2 22 - 32 mmol/L 27 27 26   Calcium 8.9 - 10.3 mg/dL 10.5(H) 10.4(H) 10.4(H)   Assessment/Plan: Jesus Roberson is 45yo person with schizophrenia, uncontrolled HIV, CKDIIIb, peripheral neuropathy, hepatitis B, history of seizures admitted 5/21 with suicide attempt and acute kidney injury. Jesus Roberson has been medically stable and cleared for discharge to inpatient psychiatry bed since 5/22.  Principal Problem:   Schizophrenia (HCC) Active Problems:   Acute kidney injury (HCC)   MDD (major depressive disorder), recurrent episode, severe (HCC)   Polysubstance abuse  (HCC)   Suicidal intent  #Suicidal ideation 2/2 active schizophrenia Patient has remained stable over the last few days with minimal outbursts. Psychiatry saw Jesus Roberson this morning and has cleared him for release. He is also medically cleared for discharge. Will have social work assist in obtaining PCP with Jesus Roberson as well as outpatient psychiatry follow-up. - Zyprexa 10mg  BID + afternoon Zyprexa 5mg  - Agitation orders PRN - 1:1 sitter - Medically stable, discharge pending outpatient follow-up for psychiatry  #Acute kidney injury on chronic kidney disease #Hyperkalemia Renal function has improved since receiving 1L bolus yesterday in addition to stopping Bactrim. He also was hyperkalemic this morning, likely the result of Bactrim in setting of CKD and ARB use. Will need to follow-up with PCP and nephrologist for further evaluation. - Follow-up BMP - Strict I/O - Avoid nephrotoxins  #Uncontrolled HIV #Active hepatitis B infection CD4 196, hepatitis B viral load >1,600,000. At this point will hold off PJP prophylaxis as both Bactrim and dapsone are contraindicated due to his co-morbidities. Will discharge with Descovy and Tivicay and have patient follow-up with infectious disease. - Tivicay, Descovy - Follow-up infectious disease  #Hypertension Blood pressures have remained stable, normotensive on current regimen. - Losartan 50mg  daily - Amlodipine 5mg  daily - Metoprolol 25mg  daily - Daily BMP  DIET: HH IVF: n/a DVT PPX: Lovenox BOWEL: Miralax, Senokot-S CODE: FULL FAM  COM: n/a  Prior to Admission Living Arrangement: Unhomed Anticipated Discharge Location: Unhomed Barriers to Discharge: setting up psych follow-up Dispo: Anticipated discharge in approximately 0-1 day(s).   Evlyn Kanner, MD 03/05/2021, 6:35 AM Pager: [phone] After 5pm on weekdays and 1pm on weekends: On Call pager [phone]

## 2021-03-05 NOTE — Plan of Care (Signed)
  Problem: Safety: Goal: Violent Restraint(s) 03/05/2021 1615 by Katrina Stack, RN Outcome: Adequate for Discharge 03/05/2021 0801 by Katrina Stack, RN Outcome: Progressing   Problem: Health Behavior/Discharge Planning: Goal: Ability to manage health-related needs will improve Outcome: Adequate for Discharge   Problem: Clinical Measurements: Goal: Ability to maintain clinical measurements within normal limits will improve Outcome: Adequate for Discharge Goal: Will remain free from infection Outcome: Adequate for Discharge   Problem: Coping: Goal: Level of anxiety will decrease 03/05/2021 1615 by Katrina Stack, RN Outcome: Adequate for Discharge 03/05/2021 0801 by Katrina Stack, RN Outcome: Progressing   Problem: Elimination: Goal: Will not experience complications related to bowel motility Outcome: Adequate for Discharge Goal: Will not experience complications related to urinary retention Outcome: Adequate for Discharge   Problem: Safety: Goal: Ability to remain free from injury will improve Outcome: Adequate for Discharge   Problem: Skin Integrity: Goal: Risk for impaired skin integrity will decrease Outcome: Adequate for Discharge

## 2021-03-05 NOTE — TOC Progression Note (Signed)
Transition of Care [facility]) - Progression Note    Patient Details  Name: Jesus Roberson MRN: [id number] Date of Birth: 1975-10-12  Transition [facility] CM/SW Contact  Okey Dupre Lazaro Arms, LCSW Phone Number: 03/05/2021, 4:43 PM  Clinical Narrative:  Patient cleared by psychiatry and medically ready for discharge. CSW completed Notice of Commitment Change form, had MD to sign and faxed to [facility] [employer], and copy placed in patient's chart.  [facility] on [address] contacted [phone]) regarding patient receiving outpatient mental health services. CSW advised that patient can walk-in Monday through Thursday, from 8 am - 11 am. CSW advised that patient should arrive approx. 7:30 am in order to be seen on the day he comes. CSW also informed that patient not having insurance is not a barrier to receiving services. [name] was provided with written information on [facility] and given walk-in days and times. Stressed to [name] the importance of being there at 7:30 as recommended by staff person, in order to be seen. Patient was also encouraged to go this week. [name] reported that he is employed and will talk with his boss.  When CSW visited with patient, he was on the phone with his wife and CSW was allowed to talk with wife and provide information to her regarding patient going to the [facility].       Barriers to Discharge: No Barriers Identified  Expected Discharge Plan and Services           Expected Discharge Date: 03/05/21                                   Social Determinants of Health (SDOH) Interventions  Patient no longer in need of inpatient psychiatric placement, however he continues to need outpatient psychiatric services.  Readmission Risk Interventions No flowsheet data found.
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
## 2021-03-05 NOTE — Plan of Care (Signed)
  Problem: Safety: Goal: Violent Restraint(s) Outcome: Progressing   Problem: Coping: Goal: Level of anxiety will decrease Outcome: Progressing

## 2021-03-05 NOTE — Progress Notes (Signed)
DISCHARGE NOTE HOME Jesus Roberson to be discharged Home per MD order. Discussed prescriptions and follow up appointments with the patient. Prescriptions given to patient; medication list explained in detail. Patient verbalized understanding.  Skin clean, dry and intact without evidence of skin break down, no evidence of skin tears noted. IV catheter discontinued intact. Site without signs and symptoms of complications. Dressing and pressure applied. Pt denies pain at the site currently. No complaints noted.  Patient free of lines, drains, and wounds.   An After Visit Summary (AVS) was printed and given to the patient. Patient escorted via wheelchair, and discharged home via private auto.  Katrina Stack, RN

## 2021-03-06 LAB — CULTURE, BLOOD (ROUTINE X 2)
Culture: NO GROWTH
Culture: NO GROWTH
Special Requests: ADEQUATE

## 2021-03-12 ENCOUNTER — Telehealth: Payer: Self-pay

## 2021-03-12 NOTE — Telephone Encounter (Signed)
[facility] Patient Advocate Encounter  Patient's medication (Tivicay 3 month supply) have been couriered to [facility] from [facility] and will be picked up 03/14/21.  Clearance Coots , CPhT Specialty Pharmacy Patient [facility] Phone: [phone] Fax:  [phone]

## 2021-03-14 ENCOUNTER — Inpatient Hospital Stay: Payer: Self-pay | Admitting: Infectious Diseases

## 2021-03-14 ENCOUNTER — Encounter: Payer: Self-pay | Admitting: Infectious Diseases

## 2021-03-14 DIAGNOSIS — B20 Human immunodeficiency virus [HIV] disease: Secondary | ICD-10-CM | POA: Insufficient documentation

## 2021-03-14 DIAGNOSIS — B181 Chronic viral hepatitis B without delta-agent: Secondary | ICD-10-CM

## 2021-03-14 HISTORY — DX: Chronic viral hepatitis B without delta-agent: B18.1

## 2021-03-29 ENCOUNTER — Emergency Department (HOSPITAL_COMMUNITY)
Admission: EM | Admit: 2021-03-29 | Discharge: 2021-03-29 | Disposition: A | Discharge status: Home / Self Care | Payer: Self-pay | Attending: Emergency Medicine | Admitting: Emergency Medicine

## 2021-03-29 ENCOUNTER — Other Ambulatory Visit: Payer: Self-pay

## 2021-03-29 ENCOUNTER — Encounter (HOSPITAL_COMMUNITY): Payer: Self-pay | Admitting: Emergency Medicine

## 2021-03-29 ENCOUNTER — Emergency Department (HOSPITAL_COMMUNITY): Payer: Self-pay

## 2021-03-29 DIAGNOSIS — Y990 Civilian activity done for income or pay: Secondary | ICD-10-CM | POA: Insufficient documentation

## 2021-03-29 DIAGNOSIS — S61210A Laceration without foreign body of right index finger without damage to nail, initial encounter: Secondary | ICD-10-CM | POA: Insufficient documentation

## 2021-03-29 DIAGNOSIS — S61411A Laceration without foreign body of right hand, initial encounter: Secondary | ICD-10-CM

## 2021-03-29 DIAGNOSIS — W274XXA Contact with kitchen utensil, initial encounter: Secondary | ICD-10-CM | POA: Insufficient documentation

## 2021-03-29 MED ORDER — CEPHALEXIN 500 MG PO CAPS: 500.0000 mg | ORAL_CAPSULE | Freq: Two times a day (BID) | ORAL | 0 refills | Status: AC

## 2021-03-29 MED ORDER — ACETAMINOPHEN 500 MG PO TABS
1000.0000 mg | ORAL_TABLET | Freq: Once | ORAL | Status: AC
  Administered 2021-03-29: 1000 mg via ORAL
  Filled 2021-03-29: qty 2

## 2021-03-29 MED ORDER — NAPROXEN 500 MG PO TABS: 500.0000 mg | ORAL_TABLET | Freq: Two times a day (BID) | ORAL | 0 refills | Status: DC

## 2021-03-29 MED ORDER — CEPHALEXIN 250 MG PO CAPS
500.0000 mg | ORAL_CAPSULE | Freq: Once | ORAL | Status: AC
  Administered 2021-03-29: 500 mg via ORAL
  Filled 2021-03-29: qty 2

## 2021-03-29 MED ORDER — LIDOCAINE HCL (PF) 1 % IJ SOLN: 10.0000 mL | Freq: Once | INTRAMUSCULAR | Status: DC

## 2021-03-29 MED ORDER — OXYCODONE-ACETAMINOPHEN 5-325 MG PO TABS: 1.0000 | ORAL_TABLET | Freq: Once | ORAL | Status: DC

## 2021-03-29 NOTE — ED Notes (Signed)
Called X-ray and said they would bring the pt to H12.

## 2021-03-29 NOTE — ED Triage Notes (Signed)
Pt here from home with a lac to the right hand from work yesterday , right hand is swollen

## 2021-03-29 NOTE — Discharge Instructions (Addendum)
Keep area clean. Run warm soapy water over wound after 24 hours.  Take the antibiotics as prescribed  Return for new or worsening sympomts

## 2021-03-29 NOTE — ED Provider Notes (Signed)
[facility] EMERGENCY DEPARTMENT Provider Note   CSN: [id number] Arrival date & time: 03/29/21  1143    History Hand laceration   Chad Campos is a 45 y.o. male with no significant past medical history who presents for evaluation of laceration to right hand. Occurred yesterday afternoon while at work. Sandwiched hand behind an Visual merchandiser. Laceration to second digit just distal to [facility]. Was not assessed at that time. Has been putting hand sanitizer on it. Last tetanus within the last 5 years. Has noted swelling today, mild bruising. No redness or warmth. No swelling into digits. Tenderness to movement of hand with grip strength at 2nd, 3rd digit. No numbness. Not followed by Orthopedics. Rates pain a 5/10. Denies additional aggravating or alleviating factors.   History obtained from patient and past medical records. No interpretor was used.    HPI     No past medical history on file.  There are no problems to display for this patient.   No past surgical history on file.     No family history on file.  Social History   Tobacco Use   Smoking status: Never   Smokeless tobacco: Never  Substance Use Topics   Alcohol use: Never   Drug use: Never    Home Medications Prior to Admission medications   Medication Sig Start Date End Date Taking? Authorizing Provider  cephALEXin (KEFLEX) 500 MG capsule Take 1 capsule (500 mg total) by mouth 2 (two) times daily for 7 days. 03/29/21 04/05/21 Yes Jani Ploeger A, PA-C  naproxen (NAPROSYN) 500 MG tablet Take 1 tablet (500 mg total) by mouth 2 (two) times daily. 03/29/21  Yes Lottie Siska A, PA-C    Allergies    Patient has no known allergies.  Review of Systems   Review of Systems  Constitutional: Negative.   HENT: Negative.    Respiratory: Negative.    Cardiovascular: Negative.   Gastrointestinal: Negative.   Genitourinary: Negative.   Musculoskeletal:        Right hand pain  Skin:  Positive for  wound.  Neurological: Negative.   All other systems reviewed and are negative.  Physical Exam Updated Vital Signs BP (!) 149/100   Pulse 71   Temp 98.3 F (36.8 C) (Oral)   Resp 16   SpO2 100%   Physical Exam Vitals and nursing note reviewed.  Constitutional:      General: He is not in acute distress.    Appearance: He is well-developed. He is not ill-appearing, toxic-appearing or diaphoretic.  HENT:     Head: Normocephalic and atraumatic.     Nose: Nose normal.     Mouth/Throat:     Mouth: Mucous membranes are moist.  Eyes:     Pupils: Pupils are equal, round, and reactive to light.  Cardiovascular:     Rate and Rhythm: Normal rate and regular rhythm.     Pulses: Normal pulses.          Radial pulses are 2+ on the right side and 2+ on the left side.     Heart sounds: Normal heart sounds.  Pulmonary:     Effort: Pulmonary effort is normal. No respiratory distress.     Breath sounds: Normal breath sounds.  Abdominal:     General: There is no distension.     Palpations: Abdomen is soft.  Musculoskeletal:        General: Normal range of motion.     Right wrist: Normal.  Left wrist: Normal.     Right hand: Swelling, laceration and tenderness present. No bony tenderness. Normal range of motion. Normal capillary refill. Normal pulse.     Left hand: Normal.     Cervical back: Normal range of motion and neck supple.     Comments: Tenderness to second digit right upper extremity as well as diffuse dorsal tenderness to hand.  Some overlying swelling to radial aspect hand.  No fusiform swelling to digits.  He is able to flex and extend his digits on his right upper extremity.  Full range of motion wrist. No tenderness over forearm, wrist  Skin:    General: Skin is warm and dry.     Capillary Refill: Capillary refill takes less than 2 seconds.     Comments: 2 cm laceration just distal to [facility] on 2nd digit right upper extremity.  Laceration does not appear to be over joint line  No  active bleeding or drainage.  No surrounding erythema or warmth to entire right upper extremity. Bruising to dorsal hand, radial aspect  Neurological:     General: No focal deficit present.     Mental Status: He is alert and oriented to person, place, and time.     Cranial Nerves: Cranial nerves are intact.     Sensory: Sensation is intact.     Comments: Intact sensation Decreased grip strength on right RE likely secondary to pain.        ED Results / Procedures / Treatments   Labs (all labs ordered are listed, but only abnormal results are displayed) Labs Reviewed - No data to display  EKG None  Radiology DG Hand Complete Right  Result Date: 03/29/2021 CLINICAL DATA:  Laceration to index finger EXAM: RIGHT HAND - COMPLETE 3+ VIEW COMPARISON:  None. FINDINGS: No fracture dislocation of the second digit.  No foreign body. IMPRESSION: No acute osseous abnormality. Electronically Signed   By: Genevive Bi M.D.   On: 03/29/2021 12:30    Procedures .Marland KitchenLaceration Repair  Date/Time: 03/29/2021 1:32 PM Performed by: Ralph Leyden A, PA-C Authorized by: Linwood Dibbles, PA-C   Consent:    Consent obtained:  Verbal   Consent given by:  Patient   Risks discussed:  Infection, need for additional repair, pain, poor cosmetic result and poor wound healing   Alternatives discussed:  No treatment and delayed treatment Universal protocol:    Procedure explained and questions answered to patient or proxy's satisfaction: yes     Relevant documents present and verified: yes     Test results available: yes     Imaging studies available: yes     Required blood products, implants, devices, and special equipment available: yes     Site/side marked: yes     Immediately prior to procedure, a time out was called: yes     Patient identity confirmed:  Verbally with patient Anesthesia:    Anesthesia method:  None Laceration details:    Location:  Finger   Finger location:  R index  finger   Length (cm):  2   Depth (mm):  4 Pre-procedure details:    Preparation:  Patient was prepped and draped in usual sterile fashion and imaging obtained to evaluate for foreign bodies Exploration:    Limited defect created (wound extended): no     Hemostasis achieved with:  Direct pressure   Imaging obtained: x-ray     Imaging outcome: foreign body not noted     Wound exploration: wound explored through  full range of motion and entire depth of wound visualized     Wound extent: no foreign bodies/material noted, no muscle damage noted, no nerve damage noted, no tendon damage noted, no underlying fracture noted and no vascular damage noted   Treatment:    Area cleansed with:  Soap and water   Amount of cleaning:  Extensive   Irrigation solution:  Sterile saline   Irrigation volume:  1L   Irrigation method:  Pressure wash   Debridement:  None Skin repair:    Repair method:  Steri-Strips   Number of Steri-Strips:  3 Approximation:    Approximation:  Loose Repair type:    Repair type:  Intermediate Post-procedure details:    Dressing:  Non-adherent dressing   Procedure completion:  Tolerated well, no immediate complications   Medications Ordered in ED Medications  acetaminophen (TYLENOL) tablet 1,000 mg (1,000 mg Oral Given 03/29/21 1405)  cephALEXin (KEFLEX) capsule 500 mg (500 mg Oral Given 03/29/21 1406)   ED Course  I have reviewed the triage vital signs and the nursing notes.  Pertinent labs & imaging results that were available during my care of the patient were reviewed by me and considered in my medical decision making (see chart for details).  45 year old here for evaluation of hand injury which occurred approximately 22 hours PTA.  Afebrile, nonseptic, not ill-appearing.  Neurovascularly intact.  Apparently sandwiched his hand behind [employer] oven in between wall.  Suffered approx 2 cm laceration to 2nd digit just distal metacarpal on his second  digit on his upper extremities to right.  He noted some bruising and some swelling.  His tetanus is up-to-date.  No active bleeding or drainage.  No surrounding erythema or warmth to extremity.  No fusiform swelling to digits.  He is able to flex and extend at his digits as well as metacarpals.  Low suspicion for flexor tenosynovitis.  Laceration is close to metacarpal joint line however low suspicion laceration into joint space.  X-ray obtained from triage which I personally reviewed and interpreted.  No fracture or dislocation.  Patient does not appear to have overlying cellulitis or abscess on exam.  Given ecchymosis and swelling is likely due to soft tissue injury from pressing against wall.  Wound is not gaping.  I did discuss risk versus benefit of suturing wound given timing of almost 24 hours from initial injury versus healing by secondary intent with placing some Steri-Strips to ensure not further opening of wound. Patient does not want suture closure at this time. Steri strips placed after extensive irrigation. Will dc home with Abx and close outpatient FU. He will return for new or worsening symptoms.  The patient has been appropriately medically screened and/or stabilized in the ED. I have low suspicion for any other emergent medical condition which would require further screening, evaluation or treatment in the ED or require inpatient management.  Patient is hemodynamically stable and in no acute distress.  Patient able to ambulate in department prior to ED.  Evaluation does not show acute pathology that would require ongoing or additional emergent interventions while in the emergency department or further inpatient treatment.  I have discussed the diagnosis with the patient and answered all questions.  Pain is been managed while in the emergency department and patient has no further complaints prior to discharge.  Patient is comfortable with plan discussed in room and is stable for discharge at  this time.  I have discussed strict return precautions for returning  to the emergency department.  Patient was encouraged to follow-up with PCP/specialist refer to at discharge.     MDM Rules/Calculators/A&P                           Final Clinical Impression(s) / ED Diagnoses Final diagnoses:  Laceration of right hand without foreign body, initial encounter    Rx / DC Orders ED Discharge Orders          Ordered    cephALEXin (KEFLEX) 500 MG capsule  2 times daily        03/29/21 1349    naproxen (NAPROSYN) 500 MG tablet  2 times daily        03/29/21 1349             Cary Lothrop A, PA-C 03/29/21 1520    Pricilla LovelessGoldston, Scott, MD 04/01/21 [phone]

## 2021-04-08 ENCOUNTER — Emergency Department (HOSPITAL_COMMUNITY)
Admission: EM | Admit: 2021-04-08 | Discharge: 2021-04-08 | Discharge status: Against Medical Advice | Payer: Self-pay | Attending: Emergency Medicine | Admitting: Emergency Medicine

## 2021-04-08 ENCOUNTER — Emergency Department (HOSPITAL_COMMUNITY): Payer: Self-pay

## 2021-04-08 ENCOUNTER — Encounter (HOSPITAL_COMMUNITY): Payer: Self-pay | Admitting: Emergency Medicine

## 2021-04-08 ENCOUNTER — Other Ambulatory Visit: Payer: Self-pay

## 2021-04-08 DIAGNOSIS — S61411D Laceration without foreign body of right hand, subsequent encounter: Secondary | ICD-10-CM | POA: Insufficient documentation

## 2021-04-08 DIAGNOSIS — W269XXD Contact with unspecified sharp object(s), subsequent encounter: Secondary | ICD-10-CM | POA: Insufficient documentation

## 2021-04-08 DIAGNOSIS — L089 Local infection of the skin and subcutaneous tissue, unspecified: Secondary | ICD-10-CM

## 2021-04-08 DIAGNOSIS — N189 Chronic kidney disease, unspecified: Secondary | ICD-10-CM | POA: Insufficient documentation

## 2021-04-08 DIAGNOSIS — B998 Other infectious disease: Secondary | ICD-10-CM | POA: Insufficient documentation

## 2021-04-08 LAB — CBC WITH DIFFERENTIAL/PLATELET
Abs Immature Granulocytes: 0.01 10*3/uL (ref 0.00–0.07)
Basophils Absolute: 0 10*3/uL (ref 0.0–0.1)
Basophils Relative: 1 %
Eosinophils Absolute: 0.1 10*3/uL (ref 0.0–0.5)
Eosinophils Relative: 2 %
HCT: 38 % — ABNORMAL LOW (ref 39.0–52.0)
Hemoglobin: 11.1 g/dL — ABNORMAL LOW (ref 13.0–17.0)
Immature Granulocytes: 0 %
Lymphocytes Relative: 31 %
Lymphs Abs: 1.3 10*3/uL (ref 0.7–4.0)
MCH: 24.3 pg — ABNORMAL LOW (ref 26.0–34.0)
MCHC: 29.2 g/dL — ABNORMAL LOW (ref 30.0–36.0)
MCV: 83.2 fL (ref 80.0–100.0)
Monocytes Absolute: 0.6 10*3/uL (ref 0.1–1.0)
Monocytes Relative: 14 %
Neutro Abs: 2.3 10*3/uL (ref 1.7–7.7)
Neutrophils Relative %: 52 %
Platelets: 150 10*3/uL (ref 150–400)
RBC: 4.57 MIL/uL (ref 4.22–5.81)
RDW: 14.3 % (ref 11.5–15.5)
WBC: 4.3 10*3/uL (ref 4.0–10.5)
nRBC: 0 % (ref 0.0–0.2)

## 2021-04-08 LAB — COMPREHENSIVE METABOLIC PANEL
ALT: 26 U/L (ref 0–44)
AST: 36 U/L (ref 15–41)
Albumin: 3.2 g/dL — ABNORMAL LOW (ref 3.5–5.0)
Alkaline Phosphatase: 68 U/L (ref 38–126)
Anion gap: 5 (ref 5–15)
BUN: 23 mg/dL — ABNORMAL HIGH (ref 6–20)
CO2: 27 mmol/L (ref 22–32)
Calcium: 9.5 mg/dL (ref 8.9–10.3)
Chloride: 107 mmol/L (ref 98–111)
Creatinine, Ser: 2.54 mg/dL — ABNORMAL HIGH (ref 0.61–1.24)
GFR, Estimated: 31 mL/min — ABNORMAL LOW (ref >60)
Glucose, Bld: 81 mg/dL (ref 70–99)
Potassium: 3.9 mmol/L (ref 3.5–5.1)
Sodium: 139 mmol/L (ref 135–145)
Total Bilirubin: 0.7 mg/dL (ref 0.3–1.2)
Total Protein: 8.3 g/dL — ABNORMAL HIGH (ref 6.5–8.1)

## 2021-04-08 MED ORDER — VANCOMYCIN HCL 1000 MG/200ML IV SOLN: 1000.0000 mg | INTRAVENOUS | Status: DC

## 2021-04-08 MED ORDER — VANCOMYCIN HCL 1500 MG/300ML IV SOLN
1500.0000 mg | INTRAVENOUS | Status: DC
  Filled 2021-04-08: qty 300

## 2021-04-08 MED ORDER — MORPHINE SULFATE (PF) 4 MG/ML IV SOLN
4.0000 mg | Freq: Once | INTRAVENOUS | Status: DC
  Filled 2021-04-08: qty 1

## 2021-04-08 MED ORDER — CLINDAMYCIN HCL 150 MG PO CAPS: 450.0000 mg | ORAL_CAPSULE | Freq: Four times a day (QID) | ORAL | 0 refills | Status: AC

## 2021-04-08 MED ORDER — ONDANSETRON HCL 4 MG/2ML IJ SOLN: 4.0000 mg | Freq: Once | INTRAMUSCULAR | Status: DC

## 2021-04-08 MED ORDER — TETANUS-DIPHTH-ACELL PERTUSSIS 5-2.5-18.5 LF-MCG/0.5 IM SUSY
0.5000 mL | PREFILLED_SYRINGE | Freq: Once | INTRAMUSCULAR | Status: DC
  Filled 2021-04-08: qty 0.5

## 2021-04-08 MED ORDER — SODIUM CHLORIDE 0.9 % IV SOLN: 2.0000 g | Freq: Two times a day (BID) | INTRAVENOUS | Status: DC

## 2021-04-08 MED ORDER — ONDANSETRON 4 MG PO TBDP: 4.0000 mg | ORAL_TABLET | Freq: Three times a day (TID) | ORAL | 0 refills | Status: DC | PRN

## 2021-04-08 NOTE — Progress Notes (Signed)
[facility]ST consulted for PIV insertion. This RN in to assess patient for IV placement when patient began expressing feeling "agitated" and "upset," states that he doesn't know the plan and is not going to stay over night on July 4th, that he has things to do and is tired of waiting.   This RN asked patient if he consented to IV placement since he was not willing to stay in hospital. Patient initially agreed to IV insertion but during attempt, patient would not hold still, states that we "can't see the vein." Assured patient that vein was visible, using ultrasound for placement, but that he is moving too much.   Patient still states to stop, that he is tired of being stuck, and remains very agitated. This RN notified primary RN, patient remains without IV at this time, and may re-consult IV team if patient more cooperative with treatment plan.

## 2021-04-08 NOTE — ED Provider Notes (Signed)
[facility] EMERGENCY DEPARTMENT Provider Note   CSN: [id number] Arrival date & time: 04/08/21  1428     History Chief Complaint  Patient presents with   Wound Check    Amadi Frady is a 45 y.o. male.  HPI     Tarvaris Puglia is a 45 y.o. male, with a history of CKD, presenting to the ED with concern for infection to the right hand from a laceration that occurred June 23. States he thinks he sustained a laceration when he was moving a stove as part of his job. Patient was seen for his laceration in the emergency department June 24. Due to the time since injury occurred, wound was secured with Steri-Strips.  He was prescribed Keflex, but states he did not fill this prescription because he cannot afford it. He had worsening swelling and then about 2 days ago he noticed purulent discharge. Unknown tetanus status. Denies fever, numbness, other injuries.  History reviewed. No pertinent past medical history.  There are no problems to display for this patient.   History reviewed. No pertinent surgical history.     No family history on file.  Social History   Tobacco Use   Smoking status: Never   Smokeless tobacco: Never  Substance Use Topics   Alcohol use: Never   Drug use: Never    Home Medications Prior to Admission medications   Medication Sig Start Date End Date Taking? Authorizing Provider  clindamycin (CLEOCIN) 150 MG capsule Take 3 capsules (450 mg total) by mouth every 6 (six) hours for 10 days. 04/08/21 04/18/21 Yes Eupha Lobb C, PA-C  ondansetron (ZOFRAN ODT) 4 MG disintegrating tablet Take 1 tablet (4 mg total) by mouth every 8 (eight) hours as needed for nausea or vomiting. 04/08/21  Yes Navneet Schmuck C, PA-C  naproxen (NAPROSYN) 500 MG tablet Take 1 tablet (500 mg total) by mouth 2 (two) times daily. 03/29/21   Henderly, Britni A, PA-C    Allergies    Patient has no known allergies.  Review of Systems   Review of Systems  Constitutional:   Negative for fever.  Musculoskeletal:  Positive for arthralgias and joint swelling.  Skin:  Positive for wound.  Neurological:  Negative for numbness.  All other systems reviewed and are negative.  Physical Exam Updated Vital Signs BP (!) 182/122   Pulse 74   Temp 98 F (36.7 C) (Oral)   Resp 16   SpO2 100%   Physical Exam Vitals and nursing note reviewed.  Constitutional:      General: He is not in acute distress.    Appearance: Normal appearance. He is well-developed. He is not diaphoretic.  HENT:     Head: Normocephalic and atraumatic.     Mouth/Throat:     Mouth: Mucous membranes are moist.  Eyes:     Conjunctiva/sclera: Conjunctivae normal.  Cardiovascular:     Rate and Rhythm: Normal rate and regular rhythm.     Pulses: Normal pulses.          Radial pulses are 2+ on the right side.  Pulmonary:     Effort: Pulmonary effort is normal. No respiratory distress.  Abdominal:     Tenderness: There is no guarding.  Musculoskeletal:     Cervical back: Neck supple.     Comments: Wound with purulent drainage to the right dorsal hand over the MCP joint of the index finger. Significant swelling and exquisite tenderness to this region extending into the index finger and the  dorsal hand and wrist. He has a high amount of pain with attempted flexion or extension of the index finger and really has minimal movement at the MCP joint.  Skin:    General: Skin is warm and dry.     Coloration: Skin is not pale.  Neurological:     Mental Status: He is alert and oriented to person, place, and time.     Comments: Sensation light touch grossly intact in the right hand. Motor function intact in the fingers of the right hand.  Psychiatric:        Mood and Affect: Mood and affect normal.        Speech: Speech normal.        Behavior: Behavior normal.                   Photos from June 24 after original injury:      ED Results / Procedures / Treatments   Labs (all  labs ordered are listed, but only abnormal results are displayed) Labs Reviewed  COMPREHENSIVE METABOLIC PANEL - Abnormal; Notable for the following components:      Result Value   BUN 23 (*)    Creatinine, Ser 2.54 (*)    Total Protein 8.3 (*)    Albumin 3.2 (*)    GFR, Estimated 31 (*)    All other components within normal limits  CBC WITH DIFFERENTIAL/PLATELET - Abnormal; Notable for the following components:   Hemoglobin 11.1 (*)    HCT 38.0 (*)    MCH 24.3 (*)    MCHC 29.2 (*)    All other components within normal limits  CULTURE, BLOOD (SINGLE)  RESP PANEL BY RT-PCR (FLU A&B, COVID) [id number]   BUN  Date Value Ref Range Status  04/08/2021 23 (H) 6 - 20 mg/dL Final  16/10/960412/27/2021 30 (H) 6 - 20 mg/dL Final   Creatinine, Ser  Date Value Ref Range Status  04/08/2021 2.54 (H) 0.61 - 1.24 mg/dL Final  54/09/811912/27/2021 1.472.22 (H) 0.61 - 1.24 mg/dL Final     EKG None  Radiology DG Hand Complete Right  Result Date: 04/08/2021 CLINICAL DATA:  Hand infection. Laceration of index finger in second metacarpal. EXAM: RIGHT HAND - COMPLETE 3+ VIEW COMPARISON:  Radiograph 03/29/2021 FINDINGS: There is no evidence of fracture or dislocation. No erosion, periosteal reaction or bone destruction. Soft tissue edema overlies the dorsum of the hand, as well as the index finger. No soft tissue air. No radiopaque foreign body. IMPRESSION: Soft tissue edema. No radiopaque foreign body or osseous abnormality. Electronically Signed   By: Narda RutherfordMelanie  Sanford M.D.   On: 04/08/2021 16:04    Procedures Procedures   Medications Ordered in ED Medications  Tdap (BOOSTRIX) injection 0.5 mL (has no administration in time range)  morphine 4 MG/ML injection 4 mg (has no administration in time range)  ondansetron (ZOFRAN) injection 4 mg (has no administration in time range)  vancomycin (VANCOREADY) IVPB 1500 mg/300 mL (has no administration in time range)  vancomycin (VANCOREADY) IVPB 1000 mg/200 mL (has no  administration in time range)  ceFEPIme (MAXIPIME) 2 g in sodium chloride 0.9 % 100 mL IVPB (has no administration in time range)    ED Course  I have reviewed the triage vital signs and the nursing notes.  Pertinent labs & imaging results that were available during my care of the patient were reviewed by me and considered in my medical decision making (see chart for details).  Clinical Course as of  04/08/21 2153  Mon Apr 08, 2021  2107 Spoke with Burna Mortimer, Case management. States whether the patient is discharged today or he is admitted, she will set up a matching program for the patient for any necessary prescriptions. [SJ]  2122 Multiple attempts were made to contact on call hand surgeon, listed as Dr. Izora Ribas in [place].  The number listed in Amion [phone]) led to a message that states, "There are no operators to take your call, goodbye."  There is no cell phone number listed. We checked his website and the same number was listed.  We even tried to call the listed hand surgeon for [facility] and ask if he had an alternative way to contact Dr. Izora Ribas, but he did not. Left a voicemail at his office. Sent a message through Epic. [SJ]  2130 Although patient initially agreed to allow treatment here in the ED including pain medication, IV start, and IV antibiotics, he is now refusing all treatment.  He is apparently upset because he has to get the IV placed. [SJ]    Clinical Course User Index [SJ] Narmeen Kerper, Hillard Danker, PA-C   MDM Rules/Calculators/A&P                           Patient presents with concern for wound to the right hand. Patient is nontoxic appearing, afebrile, not tachycardic, not tachypneic, not hypotensive, maintains excellent SPO2 on room air.  I have reviewed the patient's chart to obtain more information.   I reviewed and interpreted the patient's labs and radiological studies. His tetanus vaccination was updated. He has significant swelling as well as purulent discharge  from the wound.  He has a high amount of pain and a high amount of exquisite tenderness with attempted movement or palpation of the dorsal hand, giving concern for an extensor tenosynovitis.  The plan of care for IV start, analgesics, IV antibiotics, likely admission, was discussed with the patient and patient agreed. Patient had poor IV access available.  IV team was used to facilitate IV start. When IV team arrived, patient was reportedly quite agitated and verbally abusive such that he refused IV placement.  He also refused IV placement by ED RN, continuing to be verbally abusive.  He does not present as confused and it does not appear as though he is having a psychiatric episode.  He seems to be fully alert and oriented and purposeful in his speech and actions.  It was explained to the patient that we suspect he has a extensive and possibly dangerous hand infection and that untreated could result in loss of function in the hand, amputation, or serious life-threatening full-body infection.  Patient states, "Fuck you! I don't give a fuck! I want to leave."  Patient was signed out from the department AGAINST MEDICAL ADVICE.  He was encouraged to pick up and start taking his prescribed antibiotics.  Case management was utilized to facilitate his prescriptions for only a few dollars.  Findings and plan of care discussed with attending physician, Chaney Malling, MD. Dr. Silverio Lay personally evaluated and examined this patient.  Final Clinical Impression(s) / ED Diagnoses Final diagnoses:  Laceration of right hand, foreign body presence unspecified, subsequent encounter  Wound infection    Rx / DC Orders ED Discharge Orders          Ordered    clindamycin (CLEOCIN) 150 MG capsule  Every 6 hours        04/08/21 2133  ondansetron (ZOFRAN ODT) 4 MG disintegrating tablet  Every 8 hours PRN        04/08/21 2133             Concepcion Living 04/08/21 2153    Charlynne Pander, MD 04/11/21  [phone]

## 2021-04-08 NOTE — ED Notes (Signed)
Pt uncooperative with IV team and they were also unable to get IV access. Pt stated he was not going to stay all night.  Pt wanting his hand re wrapped. Expressed to patient importance of antibiotics.  Pt continued to be upset and wanted to leave.  Hand wrapped for patient.  Updated EDP

## 2021-04-08 NOTE — ED Provider Notes (Cosign Needed)
Emergency Medicine Provider Triage Evaluation Note  Chad Campos , a 45 y.o. male  was evaluated in triage.  Pt complains of hand infection.  Patient was seen in the ED on 6/24 for laceration to the hand over the index finger and second MCP.  Wound was already 22 hours old, closed loosely with Steri-Strips which patient reports fell off after about 3 days.  He was prescribed antibiotics but reports he could not afford them, now returns with open wound with some drainage and bleeding, swelling to the hand and severe pain throughout the hand.  Review of Systems  Positive: Hand infection, swelling Negative: Numbness, weakness, fever  Physical Exam  BP (!) 173/120 (BP Location: Left Arm)   Pulse 75   Temp 98 F (36.7 C) (Oral)   Resp 16   SpO2 99%  Gen:   Awake, no distress   Resp:  Normal effort  MSK:   Grossly infected wound to the right hand, see photo below Other:     Medical Decision Making  Medically screening exam initiated at 3:21 PM.  Appropriate orders placed.  Chad Campos was informed that the remainder of the evaluation will be completed by another provider, this initial triage assessment does not replace that evaluation, and the importance of remaining in the ED until their evaluation is complete.     Dartha Lodge, [place] 04/08/21 1528

## 2021-04-08 NOTE — ED Notes (Signed)
Was not able to get in contact with oncall hand surgeon

## 2021-04-08 NOTE — Discharge Instructions (Addendum)
You have chosen to leave AGAINST MEDICAL ADVICE. Should you change your mind, you are always welcome and encouraged to return to the ED. You are encouraged to follow-up with, at the very least, a primary care provider, or other similar medical professional on this matter.  You have evidence of a wound infection that may be quite serious.  Serious enough to require admission to the hospital for IV antibiotics. It is essential that you begin taking the antibiotics as prescribed to you.  Our case management team has arranged for you to pick up these antibiotics for just a few dollars. Failure to take these antibiotics and follow-up with the hand specialist could result in loss of function in the hand permanently, loss of the hand, or a serious infection throughout the body, some of which are life-threatening.

## 2021-04-08 NOTE — ED Triage Notes (Signed)
Pt here with worsening wound to right hand. Reports he cut it on something last week but has been unable to take prescribed antibiotics.

## 2021-04-08 NOTE — Progress Notes (Addendum)
Pharmacy Antibiotic Note  Chad Campos is a 45 y.o. male admitted on 04/08/2021 with  R hand cellulitis .  Pharmacy has been consulted for Vancomycin dosing.  SCr 2.54 (was 2.2 ~6 mos ago)  Addendum (2110): Adding Cefepime. Cefepime 2gm IV q12h.   Plan: Vancomycin 1500 mg IV now then 1000 mg IV Q 24 hrs. Goal AUC 400-550. Expected AUC: 440 SCr used: 2.54 Will f/u renal function, micro data, and pt's clinical condition Vanc levels prn   Height: 6\' 1"  (185.4 cm) Weight: 81.6 kg (180 lb) IBW/kg (Calculated) : 79.9  Temp (24hrs), Avg:98 F (36.7 C), Min:98 F (36.7 C), Max:98 F (36.7 C)  Recent Labs  Lab 04/08/21 1527  WBC 4.3  CREATININE 2.54*    Estimated Creatinine Clearance: 41.5 mL/min (A) (by C-G formula based on SCr of 2.54 mg/dL (H)).    No Known Allergies  Antimicrobials this admission: 7/4 Vanc >>  74 Cefepime >>  Microbiology results: 7/4 BCx:    Thank you for allowing pharmacy to be a part of this patient's care.  9/4, PharmD, BCPS Please see amion for complete clinical pharmacist phone list 04/08/2021 8:17 PM

## 2021-04-08 NOTE — ED Notes (Signed)
Pt continues to want to leave. Pt given RX for antibiotics and zofran.  Expressed importance of getting these filled and taking them as he has a danger of losing his hand. Pt stated " I could have had this taken care of 2000 hours ago" and grabbed belongings and left department ambulatory and in NAD.

## 2021-04-08 NOTE — Care Management (Signed)
Patient was made aware that [facility] letter would be sent to [facility] on [address] and that patient would need to pay co-pay of $3 per prescription.

## 2021-04-08 NOTE — Care Management (Signed)
  [facility] Medication Assistance Card Name: Coyle Stordahl ID (MRN): [id number] Bin: [id number] RX Group: [id number] Discharge Date: 04/08/2021 Expiration Date: 04/24/1999                                           (must be filled within 7 days of discharge       Dear   : Chad Campos  You have been approved to have the prescriptions written by your discharging physician filled through our [facility] (Medication Assistance Through [facility]) program. This program allows for a one-time (no refills) 34-day supply of selected medications for a low copay amount.  The copay is $3.00 per prescription. For instance, if you have one prescription, you will pay $3.00; for two prescriptions, you pay $6.00; for three prescriptions, you pay $9.00; and so on.  Only certain pharmacies are participating in this program with [facility]. You will need to select one of the pharmacies from the attached list and take your prescriptions, this letter, and your photo ID to one of the participating pharmacies.   We are excited that you are able to use the [facility] program to get your medications. These prescriptions must be filled within 7 days of hospital discharge or they will no longer be valid for the [facility] program. Should you have any problems with your prescriptions please contact your case management team member at [phone] for [place].  Thank you, [facility]

## 2021-04-08 NOTE — ED Notes (Signed)
Attempted IV access on patient. Pt uncooperative with IV placement and could not get IV  Called IV team for assistance.

## 2021-04-08 NOTE — ED Notes (Signed)
Called transition care team

## 2021-04-09 NOTE — ED Notes (Signed)
4 mg morphine pulled from pyxis before IV access challenges and pt decided to leave AMA  RN wasted 4 mg morphine in presence of Jessica F RN under direction of pharmacy d/t patient being already discharged from the pyxis.

## 2021-04-11 ENCOUNTER — Inpatient Hospital Stay: Payer: Self-pay | Admitting: Physician Assistant

## 2021-04-13 LAB — CULTURE, BLOOD (SINGLE): Culture: NO GROWTH

## 2021-05-13 ENCOUNTER — Other Ambulatory Visit: Payer: Self-pay

## 2021-05-13 ENCOUNTER — Emergency Department (HOSPITAL_COMMUNITY)
Admission: EM | Admit: 2021-05-13 | Discharge: 2021-05-13 | Discharge status: Against Medical Advice | Payer: Self-pay | Attending: Emergency Medicine | Admitting: Emergency Medicine

## 2021-05-13 DIAGNOSIS — S0101XA Laceration without foreign body of scalp, initial encounter: Secondary | ICD-10-CM | POA: Insufficient documentation

## 2021-05-13 DIAGNOSIS — Z87891 Personal history of nicotine dependence: Secondary | ICD-10-CM | POA: Insufficient documentation

## 2021-05-13 DIAGNOSIS — B20 Human immunodeficiency virus [HIV] disease: Secondary | ICD-10-CM | POA: Insufficient documentation

## 2021-05-13 DIAGNOSIS — Z23 Encounter for immunization: Secondary | ICD-10-CM | POA: Insufficient documentation

## 2021-05-13 MED ORDER — TETANUS-DIPHTH-ACELL PERTUSSIS 5-2.5-18.5 LF-MCG/0.5 IM SUSY
0.5000 mL | PREFILLED_SYRINGE | Freq: Once | INTRAMUSCULAR | Status: AC
  Administered 2021-05-13: 0.5 mL via INTRAMUSCULAR
  Filled 2021-05-13: qty 0.5

## 2021-05-13 MED ORDER — LIDOCAINE-EPINEPHRINE (PF) 2 %-1:200000 IJ SOLN
10.0000 mL | Freq: Once | INTRAMUSCULAR | Status: AC
  Administered 2021-05-13: 10 mL

## 2021-05-13 MED ORDER — HYDROCODONE-ACETAMINOPHEN 5-325 MG PO TABS: 1.0000 | ORAL_TABLET | Freq: Once | ORAL | Status: DC
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
## 2021-07-21 ENCOUNTER — Emergency Department (HOSPITAL_COMMUNITY)
Admission: EM | Admit: 2021-07-21 | Discharge: 2021-07-22 | Disposition: A | Discharge status: Home / Self Care | Payer: Self-pay | Attending: Emergency Medicine | Admitting: Emergency Medicine

## 2021-07-21 ENCOUNTER — Other Ambulatory Visit: Payer: Self-pay

## 2021-07-21 ENCOUNTER — Encounter (HOSPITAL_COMMUNITY): Payer: Self-pay

## 2021-07-21 DIAGNOSIS — Z21 Asymptomatic human immunodeficiency virus [HIV] infection status: Secondary | ICD-10-CM | POA: Insufficient documentation

## 2021-07-21 DIAGNOSIS — R519 Headache, unspecified: Secondary | ICD-10-CM | POA: Insufficient documentation

## 2021-07-21 DIAGNOSIS — Z79899 Other long term (current) drug therapy: Secondary | ICD-10-CM | POA: Insufficient documentation

## 2021-07-21 DIAGNOSIS — R112 Nausea with vomiting, unspecified: Secondary | ICD-10-CM | POA: Insufficient documentation

## 2021-07-21 DIAGNOSIS — R1110 Vomiting, unspecified: Secondary | ICD-10-CM

## 2021-07-21 DIAGNOSIS — I10 Essential (primary) hypertension: Secondary | ICD-10-CM | POA: Insufficient documentation

## 2021-07-21 HISTORY — DX: Gastric ulcer, unspecified as acute or chronic, without hemorrhage or perforation: K25.9

## 2021-07-21 HISTORY — DX: Disorder of kidney and ureter, unspecified: N28.9

## 2021-07-21 HISTORY — DX: Human immunodeficiency virus (HIV) disease: B20

## 2021-07-21 HISTORY — DX: Calculus of kidney: N20.0

## 2021-07-21 HISTORY — DX: Essential (primary) hypertension: I10

## 2021-07-21 HISTORY — DX: Polyneuropathy, unspecified: G62.9

## 2021-07-21 HISTORY — DX: Unspecified convulsions: R56.9

## 2021-07-21 NOTE — ED Triage Notes (Signed)
Patient BIB [facility] with migraine, nausea, vomiting,diarrhea since this morning. Patient homeless and wants resources for housing and potentially getting medications. Patient is HIV positive, but has not been tested for the past few months. Chronic kidney disease.

## 2021-07-22 LAB — URINALYSIS, ROUTINE W REFLEX MICROSCOPIC
Bilirubin Urine: NEGATIVE
Glucose, UA: NEGATIVE mg/dL
Ketones, ur: NEGATIVE mg/dL
Leukocytes,Ua: NEGATIVE
Nitrite: NEGATIVE
Protein, ur: 100 mg/dL — AB
Specific Gravity, Urine: 1.012 (ref 1.005–1.030)
pH: 6 (ref 5.0–8.0)

## 2021-07-22 LAB — COMPREHENSIVE METABOLIC PANEL
ALT: 55 U/L — ABNORMAL HIGH (ref 0–44)
AST: 60 U/L — ABNORMAL HIGH (ref 15–41)
Albumin: 3.2 g/dL — ABNORMAL LOW (ref 3.5–5.0)
Alkaline Phosphatase: 73 U/L (ref 38–126)
Anion gap: 4 — ABNORMAL LOW (ref 5–15)
BUN: 34 mg/dL — ABNORMAL HIGH (ref 6–20)
CO2: 26 mmol/L (ref 22–32)
Calcium: 9.2 mg/dL (ref 8.9–10.3)
Chloride: 107 mmol/L (ref 98–111)
Creatinine, Ser: 2.15 mg/dL — ABNORMAL HIGH (ref 0.61–1.24)
GFR, Estimated: 38 mL/min — ABNORMAL LOW (ref >60)
Glucose, Bld: 87 mg/dL (ref 70–99)
Potassium: 4.3 mmol/L (ref 3.5–5.1)
Sodium: 137 mmol/L (ref 135–145)
Total Bilirubin: 0.8 mg/dL (ref 0.3–1.2)
Total Protein: 8.2 g/dL — ABNORMAL HIGH (ref 6.5–8.1)

## 2021-07-22 LAB — CBC
HCT: 36.4 % — ABNORMAL LOW (ref 39.0–52.0)
Hemoglobin: 10.5 g/dL — ABNORMAL LOW (ref 13.0–17.0)
MCH: 24.2 pg — ABNORMAL LOW (ref 26.0–34.0)
MCHC: 28.8 g/dL — ABNORMAL LOW (ref 30.0–36.0)
MCV: 83.9 fL (ref 80.0–100.0)
Platelets: 111 10*3/uL — ABNORMAL LOW (ref 150–400)
RBC: 4.34 MIL/uL (ref 4.22–5.81)
RDW: 15.9 % — ABNORMAL HIGH (ref 11.5–15.5)
WBC: 4.5 10*3/uL (ref 4.0–10.5)
nRBC: 0 % (ref 0.0–0.2)

## 2021-07-22 LAB — LIPASE, BLOOD: Lipase: 58 U/L — ABNORMAL HIGH (ref 11–51)

## 2021-07-22 MED ORDER — PROCHLORPERAZINE EDISYLATE 10 MG/2ML IJ SOLN
10.0000 mg | Freq: Once | INTRAMUSCULAR | Status: AC
  Administered 2021-07-22: 10 mg via INTRAVENOUS
  Filled 2021-07-22: qty 2

## 2021-07-22 MED ORDER — HYDRALAZINE HCL 25 MG PO TABS
25.0000 mg | ORAL_TABLET | Freq: Once | ORAL | Status: AC
  Administered 2021-07-22: 25 mg via ORAL
  Filled 2021-07-22: qty 1

## 2021-07-22 MED ORDER — DIPHENHYDRAMINE HCL 50 MG/ML IJ SOLN
25.0000 mg | Freq: Once | INTRAMUSCULAR | Status: AC
  Administered 2021-07-22: 25 mg via INTRAVENOUS
  Filled 2021-07-22: qty 1

## 2021-07-22 MED ORDER — LACTATED RINGERS IV BOLUS
1000.0000 mL | Freq: Once | INTRAVENOUS | Status: AC
  Administered 2021-07-22: 1000 mL via INTRAVENOUS

## 2021-07-22 MED ORDER — AMLODIPINE BESYLATE 10 MG PO TABS: 10.0000 mg | ORAL_TABLET | Freq: Every day | ORAL | 1 refills | Status: DC

## 2021-07-22 MED ORDER — DEXAMETHASONE SODIUM PHOSPHATE 10 MG/ML IJ SOLN
10.0000 mg | Freq: Once | INTRAMUSCULAR | Status: AC
  Administered 2021-07-22: 10 mg via INTRAVENOUS
  Filled 2021-07-22: qty 1

## 2021-07-22 MED ORDER — ONDANSETRON 4 MG PO TBDP: 4.0000 mg | ORAL_TABLET | Freq: Three times a day (TID) | ORAL | 0 refills | Status: AC | PRN

## 2021-07-22 MED ORDER — NAPROXEN 500 MG PO TABS: 500.0000 mg | ORAL_TABLET | Freq: Two times a day (BID) | ORAL | 0 refills | Status: AC
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
## 2021-07-22 NOTE — ED Provider Notes (Signed)
[facility]-EMERGENCY DEPT Provider Note   CSN: [id number] Arrival date & time: 07/21/21  2255     History Chief Complaint  Patient presents with   Migraine   Emesis    Chad Campos is a 45 y.o. male.   Migraine This is a recurrent problem. The current episode started yesterday. The problem occurs constantly. The problem has not changed since onset.Pertinent negatives include no chest pain, no abdominal pain, no headaches and no shortness of breath. Nothing aggravates the symptoms. Nothing relieves the symptoms.  Emesis Severity:  Mild Timing:  Constant Progression:  Unchanged Chronicity:  Recurrent Recent urination:  Normal Relieved by:  Nothing Worsened by:  Nothing Ineffective treatments:  None tried Associated symptoms: myalgias   Associated symptoms: no abdominal pain, no cough, no headaches and no URI       Past Medical History:  Diagnosis Date   HIV (human immunodeficiency virus infection) (HCC)    Hypertension    Kidney disease    Kidney stones    Multiple gastric ulcers    Neuropathy    Seizures (HCC)     There are no problems to display for this patient.   History reviewed. No pertinent surgical history.     History reviewed. No pertinent family history.  Social History   Tobacco Use   Smoking status: Never   Smokeless tobacco: Never  Substance Use Topics   Alcohol use: Never   Drug use: Never    Home Medications Prior to Admission medications   Medication Sig Start Date End Date Taking? Authorizing Provider  amLODipine (NORVASC) 10 MG tablet Take 1 tablet (10 mg total) by mouth daily. 07/22/21  Yes Shandricka Monroy, Barbara Cower, MD  naproxen (NAPROSYN) 500 MG tablet Take 1 tablet (500 mg total) by mouth 2 (two) times daily. 07/22/21   Taniya Dasher, Barbara Cower, MD  ondansetron (ZOFRAN ODT) 4 MG disintegrating tablet Take 1 tablet (4 mg total) by mouth every 8 (eight) hours as needed for nausea or vomiting. 07/22/21   Kenai Fluegel, Barbara Cower, MD     Allergies    Patient has no known allergies.  Review of Systems   Review of Systems  Respiratory:  Negative for cough and shortness of breath.   Cardiovascular:  Negative for chest pain.  Gastrointestinal:  Positive for vomiting. Negative for abdominal pain.  Musculoskeletal:  Positive for myalgias.  Neurological:  Negative for headaches.  All other systems reviewed and are negative.  Physical Exam Updated Vital Signs BP (!) 158/103   Pulse 73   Temp 98.5 F (36.9 C) (Oral)   Resp 17   Ht 6\' 1"  (1.854 m)   Wt 82 kg   SpO2 100%   BMI 23.85 kg/m   Physical Exam Vitals and nursing note reviewed.  Constitutional:      Appearance: He is well-developed.  HENT:     Head: Normocephalic and atraumatic.     Nose: No congestion or rhinorrhea.     Mouth/Throat:     Mouth: Mucous membranes are moist.     Pharynx: Oropharynx is clear.  Eyes:     Pupils: Pupils are equal, round, and reactive to light.  Cardiovascular:     Rate and Rhythm: Normal rate.  Pulmonary:     Effort: Pulmonary effort is normal. No respiratory distress.  Abdominal:     General: There is no distension.  Musculoskeletal:        General: Normal range of motion.     Cervical back: Normal range of motion.  Skin:    General: Skin is warm and dry.  Neurological:     General: No focal deficit present.     Mental Status: He is alert.    ED Results / Procedures / Treatments   Labs (all labs ordered are listed, but only abnormal results are displayed) Labs Reviewed  LIPASE, BLOOD - Abnormal; Notable for the following components:      Result Value   Lipase 58 (*)    All other components within normal limits  COMPREHENSIVE METABOLIC PANEL - Abnormal; Notable for the following components:   BUN 34 (*)    Creatinine, Ser 2.15 (*)    Total Protein 8.2 (*)    Albumin 3.2 (*)    AST 60 (*)    ALT 55 (*)    GFR, Estimated 38 (*)    Anion gap 4 (*)    All other components within normal limits  CBC -  Abnormal; Notable for the following components:   Hemoglobin 10.5 (*)    HCT 36.4 (*)    MCH 24.2 (*)    MCHC 28.8 (*)    RDW 15.9 (*)    Platelets 111 (*)    All other components within normal limits  URINALYSIS, ROUTINE W REFLEX MICROSCOPIC - Abnormal; Notable for the following components:   Hgb urine dipstick SMALL (*)    Protein, ur 100 (*)    Bacteria, UA MANY (*)    All other components within normal limits    EKG None  Radiology No results found.  Procedures Procedures   Medications Ordered in ED Medications  lactated ringers bolus 1,000 mL (0 mLs Intravenous Stopped 07/22/21 0601)  prochlorperazine (COMPAZINE) injection 10 mg (10 mg Intravenous Given 07/22/21 0331)  diphenhydrAMINE (BENADRYL) injection 25 mg (25 mg Intravenous Given 07/22/21 0331)  dexamethasone (DECADRON) injection 10 mg (10 mg Intravenous Given 07/22/21 0330)  hydrALAZINE (APRESOLINE) tablet 25 mg (25 mg Oral Given 07/22/21 0326)    ED Course  I have reviewed the triage vital signs and the nursing notes.  Pertinent labs & imaging results that were available during my care of the patient were reviewed by me and considered in my medical decision making (see chart for details).    MDM Rules/Calculators/A&P                         Symptoms improved. TOC consulted and community resources provided to patient.    Final Clinical Impression(s) / ED Diagnoses Final diagnoses:  Vomiting, unspecified vomiting type, unspecified whether nausea present  Bad headache    Rx / DC Orders ED Discharge Orders          Ordered    ondansetron (ZOFRAN ODT) 4 MG disintegrating tablet  Every 8 hours PRN        07/22/21 0647    naproxen (NAPROSYN) 500 MG tablet  2 times daily        07/22/21 0647    amLODipine (NORVASC) 10 MG tablet  Daily        07/22/21 0647             Vineeth Fell, Barbara Cower, MD 07/22/21 2308

## 2021-08-08 ENCOUNTER — Encounter: Payer: Self-pay | Admitting: Pediatric Intensive Care

## 2021-08-08 ENCOUNTER — Other Ambulatory Visit: Payer: Self-pay

## 2021-08-08 ENCOUNTER — Ambulatory Visit: Payer: Self-pay | Attending: Physician Assistant | Admitting: Physician Assistant

## 2021-08-08 ENCOUNTER — Encounter: Payer: Self-pay | Admitting: Physician Assistant

## 2021-08-08 ENCOUNTER — Other Ambulatory Visit (HOSPITAL_COMMUNITY): Payer: Self-pay

## 2021-08-08 VITALS — BP 143/92 | HR 71 | Temp 98.8°F | Resp 18 | Ht 73.0 in | Wt 189.0 lb

## 2021-08-08 DIAGNOSIS — B20 Human immunodeficiency virus [HIV] disease: Secondary | ICD-10-CM

## 2021-08-08 DIAGNOSIS — Z9189 Other specified personal risk factors, not elsewhere classified: Secondary | ICD-10-CM

## 2021-08-08 DIAGNOSIS — Z1159 Encounter for screening for other viral diseases: Secondary | ICD-10-CM

## 2021-08-08 DIAGNOSIS — Z4802 Encounter for removal of sutures: Secondary | ICD-10-CM

## 2021-08-08 DIAGNOSIS — D696 Thrombocytopenia, unspecified: Secondary | ICD-10-CM

## 2021-08-08 DIAGNOSIS — Z13220 Encounter for screening for lipoid disorders: Secondary | ICD-10-CM

## 2021-08-08 DIAGNOSIS — I10 Essential (primary) hypertension: Secondary | ICD-10-CM

## 2021-08-08 MED ORDER — AMLODIPINE BESYLATE 10 MG PO TABS
10.0000 mg | ORAL_TABLET | Freq: Every day | ORAL | 1 refills | Status: AC
  Filled 2021-08-08: qty 30, 30d supply, fill #0

## 2021-08-08 NOTE — Progress Notes (Signed)
Patient presents for staple removal. Patient reports a new cough and runny nose that is increased at night.
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
## 2021-08-08 NOTE — Patient Instructions (Signed)
I have sent your blood pressure medication to our pharmacy.  I encourage you to use the Neosporin packets I have provided you on your head wound for the next couple of days, make sure you keep the area clean and dry.  Roney Jaffe, PA-C Physician Assistant [facility] [email or web address]   Hypertension, Adult High blood pressure (hypertension) is when the force of blood pumping through the arteries is too strong. The arteries are the blood vessels that carry blood from the heart throughout the body. Hypertension forces the heart to work harder to pump blood and may cause arteries to become narrow or stiff. Untreated or uncontrolled hypertension can cause a heart attack, heart failure, a stroke, kidney disease, and other problems. A blood pressure reading consists of a higher number over a lower number. Ideally, your blood pressure should be below 120/80. The first ("top") number is called the systolic pressure. It is a measure of the pressure in your arteries as your heart beats. The second ("bottom") number is called the diastolic pressure. It is a measure of the pressure in your arteries as the heart relaxes. What are the causes? The exact cause of this condition is not known. There are some conditions that result in or are related to high blood pressure. What increases the risk? Some risk factors for high blood pressure are under your control. The following factors may make you more likely to develop this condition: Smoking. Having type 2 diabetes mellitus, high cholesterol, or both. Not getting enough exercise or physical activity. Being overweight. Having too much fat, sugar, calories, or salt (sodium) in your diet. Drinking too much alcohol. Some risk factors for high blood pressure may be difficult or impossible to change. Some of these factors include: Having chronic kidney disease. Having a family history of high blood  pressure. Age. Risk increases with age. Race. You may be at higher risk if you are African American. Gender. Men are at higher risk than women before age 54. After age 62, women are at higher risk than men. Having obstructive sleep apnea. Stress. What are the signs or symptoms? High blood pressure may not cause symptoms. Very high blood pressure (hypertensive crisis) may cause: Headache. Anxiety. Shortness of breath. Nosebleed. Nausea and vomiting. Vision changes. Severe chest pain. Seizures. How is this diagnosed? This condition is diagnosed by measuring your blood pressure while you are seated, with your arm resting on a flat surface, your legs uncrossed, and your feet flat on the floor. The cuff of the blood pressure monitor will be placed directly against the skin of your upper arm at the level of your heart. It should be measured at least twice using the same arm. Certain conditions can cause a difference in blood pressure between your right and left arms. Certain factors can cause blood pressure readings to be lower or higher than normal for a short period of time: When your blood pressure is higher when you are in a health care provider's office than when you are at home, this is called white coat hypertension. Most people with this condition do not need medicines. When your blood pressure is higher at home than when you are in a health care provider's office, this is called masked hypertension. Most people with this condition may need medicines to control blood pressure. If you have a high blood pressure reading during one visit or you have normal blood pressure with other risk factors, you may be asked to: Return on a different  day to have your blood pressure checked again. Monitor your blood pressure at home for 1 week or longer. If you are diagnosed with hypertension, you may have other blood or imaging tests to help your health care provider understand your overall risk for other  conditions. How is this treated? This condition is treated by making healthy lifestyle changes, such as eating healthy foods, exercising more, and reducing your alcohol intake. Your health care provider may prescribe medicine if lifestyle changes are not enough to get your blood pressure under control, and if: Your systolic blood pressure is above 130. Your diastolic blood pressure is above 80. Your personal target blood pressure may vary depending on your medical conditions, your age, and other factors. Follow these instructions at home: Eating and drinking  Eat a diet that is high in fiber and potassium, and low in sodium, added sugar, and fat. An example eating plan is called the DASH (Dietary Approaches to Stop Hypertension) diet. To eat this way: Eat plenty of fresh fruits and vegetables. Try to fill one half of your plate at each meal with fruits and vegetables. Eat whole grains, such as whole-wheat pasta, brown rice, or whole-grain bread. Fill about one fourth of your plate with whole grains. Eat or drink low-fat dairy products, such as skim milk or low-fat yogurt. Avoid fatty cuts of meat, processed or cured meats, and poultry with skin. Fill about one fourth of your plate with lean proteins, such as fish, chicken without skin, beans, eggs, or tofu. Avoid pre-made and processed foods. These tend to be higher in sodium, added sugar, and fat. Reduce your daily sodium intake. Most people with hypertension should eat less than 1,500 mg of sodium a day. Do not drink alcohol if: Your health care provider tells you not to drink. You are pregnant, may be pregnant, or are planning to become pregnant. If you drink alcohol: Limit how much you use to: 0-1 drink a day for women. 0-2 drinks a day for men. Be aware of how much alcohol is in your drink. In the [place] one drink equals one 12 oz bottle of beer (355 mL), one 5 oz glass of wine (148 mL), or one 1 oz glass of hard liquor (44  mL). Lifestyle  Work with your health care provider to maintain a healthy body weight or to lose weight. Ask what an ideal weight is for you. Get at least 30 minutes of exercise most days of the week. Activities may include walking, swimming, or biking. Include exercise to strengthen your muscles (resistance exercise), such as Pilates or lifting weights, as part of your weekly exercise routine. Try to do these types of exercises for 30 minutes at least 3 days a week. Do not use any products that contain nicotine or tobacco, such as cigarettes, e-cigarettes, and chewing tobacco. If you need help quitting, ask your health care provider. Monitor your blood pressure at home as told by your health care provider. Keep all follow-up visits as told by your health care provider. This is important. Medicines Take over-the-counter and prescription medicines only as told by your health care provider. Follow directions carefully. Blood pressure medicines must be taken as prescribed. Do not skip doses of blood pressure medicine. Doing this puts you at risk for problems and can make the medicine less effective. Ask your health care provider about side effects or reactions to medicines that you should watch for. Contact a health care provider if you: Think you are having a reaction  to a medicine you are taking. Have headaches that keep coming back (recurring). Feel dizzy. Have swelling in your ankles. Have trouble with your vision. Get help right away if you: Develop a severe headache or confusion. Have unusual weakness or numbness. Feel faint. Have severe pain in your chest or abdomen. Vomit repeatedly. Have trouble breathing. Summary Hypertension is when the force of blood pumping through your arteries is too strong. If this condition is not controlled, it may put you at risk for serious complications. Your personal target blood pressure may vary depending on your medical conditions, your age, and  other factors. For most people, a normal blood pressure is less than 120/80. Hypertension is treated with lifestyle changes, medicines, or a combination of both. Lifestyle changes include losing weight, eating a healthy, low-sodium diet, exercising more, and limiting alcohol. This information is not intended to replace advice given to you by your health care provider. Make sure you discuss any questions you have with your health care provider. Document Revised: 06/02/2018 Document Reviewed: 06/02/2018 Elsevier Patient Education  2022 [employer].
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
## 2022-03-11 ENCOUNTER — Other Ambulatory Visit (HOSPITAL_COMMUNITY): Payer: Self-pay
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
